# Patient Record
Sex: Female | Born: 1935 | Race: White | Hispanic: No | Marital: Married | Smoking: Never smoker
Health system: Southern US, Community
[De-identification: ages and names within clinical notes are randomized; demographics above are authoritative.]

## PROBLEM LIST (undated history)

## (undated) DIAGNOSIS — M1990 Unspecified osteoarthritis, unspecified site: Secondary | ICD-10-CM

## (undated) DIAGNOSIS — K469 Unspecified abdominal hernia without obstruction or gangrene: Secondary | ICD-10-CM

## (undated) DIAGNOSIS — I10 Essential (primary) hypertension: Secondary | ICD-10-CM

## (undated) DIAGNOSIS — K219 Gastro-esophageal reflux disease without esophagitis: Secondary | ICD-10-CM

## (undated) DIAGNOSIS — E039 Hypothyroidism, unspecified: Secondary | ICD-10-CM

## (undated) DIAGNOSIS — I739 Peripheral vascular disease, unspecified: Secondary | ICD-10-CM

## (undated) DIAGNOSIS — I4581 Long QT syndrome: Secondary | ICD-10-CM

## (undated) DIAGNOSIS — K5790 Diverticulosis of intestine, part unspecified, without perforation or abscess without bleeding: Secondary | ICD-10-CM

## (undated) DIAGNOSIS — E785 Hyperlipidemia, unspecified: Secondary | ICD-10-CM

## (undated) DIAGNOSIS — I679 Cerebrovascular disease, unspecified: Secondary | ICD-10-CM

## (undated) HISTORY — DX: Hyperlipidemia, unspecified: E78.5

## (undated) HISTORY — PX: APPENDECTOMY: SHX54

## (undated) HISTORY — DX: Hypothyroidism, unspecified: E03.9

## (undated) HISTORY — PX: TONSILLECTOMY: SUR1361

## (undated) HISTORY — DX: Unspecified abdominal hernia without obstruction or gangrene: K46.9

## (undated) HISTORY — DX: Long QT syndrome: I45.81

## (undated) HISTORY — DX: Peripheral vascular disease, unspecified: I73.9

## (undated) HISTORY — PX: BUNIONECTOMY: SHX129

## (undated) HISTORY — DX: Gastro-esophageal reflux disease without esophagitis: K21.9

## (undated) HISTORY — DX: Diverticulosis of intestine, part unspecified, without perforation or abscess without bleeding: K57.90

## (undated) HISTORY — DX: Cerebrovascular disease, unspecified: I67.9

## (undated) HISTORY — DX: Essential (primary) hypertension: I10

## (undated) HISTORY — PX: LAMINECTOMY: SHX219

## (undated) HISTORY — DX: Unspecified osteoarthritis, unspecified site: M19.90

## (undated) HISTORY — PX: OTHER SURGICAL HISTORY: SHX169

---

## 2005-07-03 ENCOUNTER — Ambulatory Visit: Payer: Self-pay | Admitting: Cardiology

## 2005-07-27 ENCOUNTER — Ambulatory Visit: Payer: Self-pay

## 2005-07-27 ENCOUNTER — Encounter: Payer: Self-pay | Admitting: Cardiology

## 2005-08-19 ENCOUNTER — Ambulatory Visit: Payer: Self-pay | Admitting: Cardiology

## 2005-09-03 ENCOUNTER — Ambulatory Visit: Payer: Self-pay | Admitting: Cardiology

## 2005-09-17 ENCOUNTER — Ambulatory Visit: Payer: Self-pay | Admitting: Internal Medicine

## 2005-10-12 ENCOUNTER — Ambulatory Visit: Payer: Self-pay | Admitting: Cardiology

## 2005-10-21 ENCOUNTER — Ambulatory Visit: Payer: Self-pay | Admitting: Cardiology

## 2006-01-27 ENCOUNTER — Ambulatory Visit: Payer: Self-pay | Admitting: Cardiology

## 2006-02-26 ENCOUNTER — Ambulatory Visit: Payer: Self-pay | Admitting: Cardiovascular Disease

## 2006-04-21 ENCOUNTER — Ambulatory Visit: Payer: Self-pay | Admitting: Cardiology

## 2006-07-07 ENCOUNTER — Ambulatory Visit: Payer: Self-pay | Admitting: Cardiology

## 2006-07-21 ENCOUNTER — Ambulatory Visit: Payer: Self-pay

## 2006-09-16 ENCOUNTER — Ambulatory Visit: Payer: Self-pay | Admitting: Cardiology

## 2006-12-15 ENCOUNTER — Ambulatory Visit: Payer: Self-pay | Admitting: Cardiology

## 2006-12-22 ENCOUNTER — Ambulatory Visit: Payer: Self-pay

## 2006-12-22 ENCOUNTER — Ambulatory Visit: Payer: Self-pay | Admitting: Cardiology

## 2006-12-22 LAB — CONVERTED CEMR LAB
BUN: 11 mg/dL (ref 6–23)
Calcium: 8.7 mg/dL (ref 8.4–10.5)
Chloride: 104 meq/L (ref 96–112)
Creatinine, Ser: 0.8 mg/dL (ref 0.4–1.2)
GFR calc Af Amer: 89 mL/min
Glucose, Bld: 87 mg/dL (ref 70–99)
Potassium: 4.2 meq/L (ref 3.5–5.1)
Sodium: 138 meq/L (ref 135–145)

## 2007-04-13 IMAGING — CT CT ANGIO ABDOMEN
1 of 6 series · 14 of 42 positions shown, 18 images · IV contrast (APPLIED)
Comparison: none

CLINICAL DATA: Abdominal pain and diverticulosis.

[Series 6: cta_abd 0.75 b10f st · axial · 0.39mm/px · z∈[-312,-42]mm · 14 of 595 slices shown, 18 images]
[im 28/595  soft-tissue]
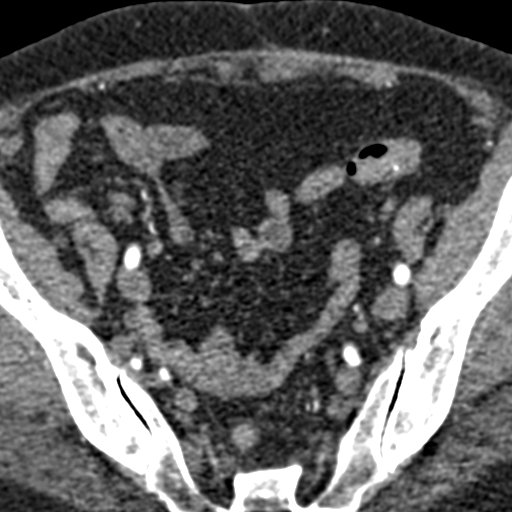
[im 28/595  bone]
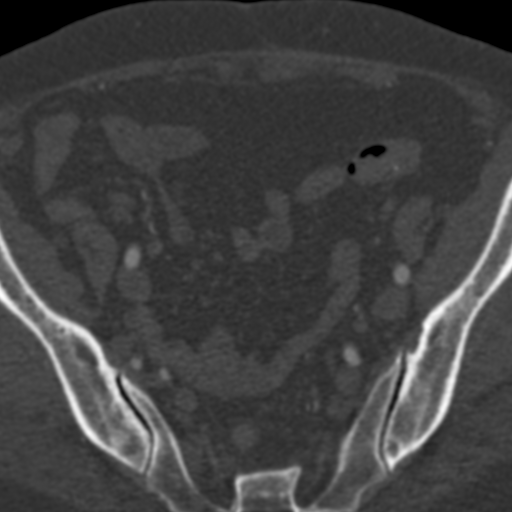
[im 82/595  soft-tissue]
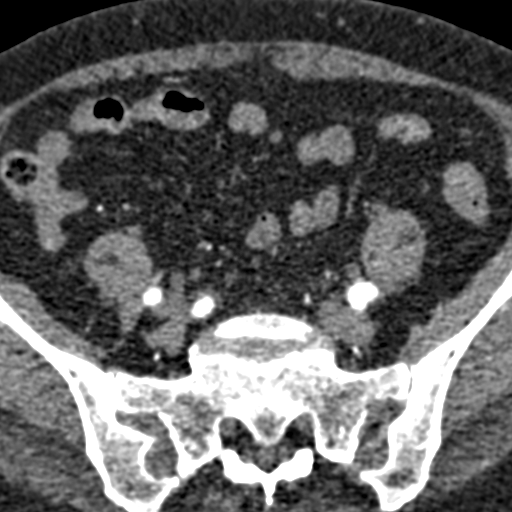
[im 136/595  soft-tissue]
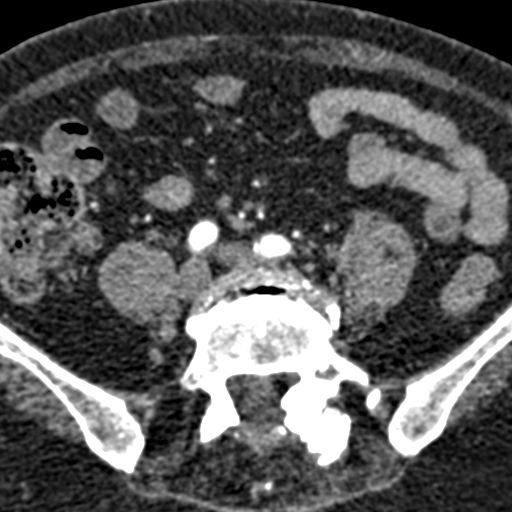
[im 190/595  soft-tissue]
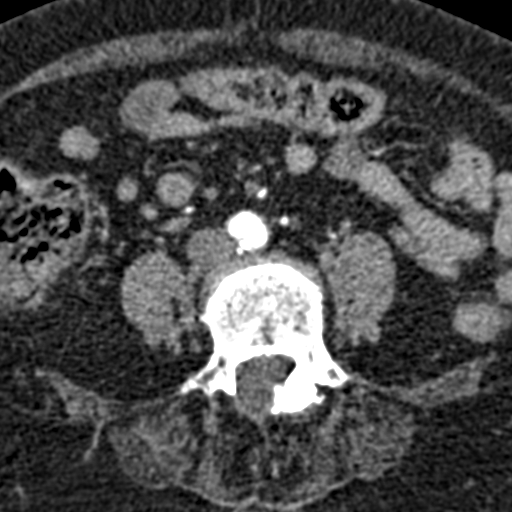
[im 217/595  soft-tissue]
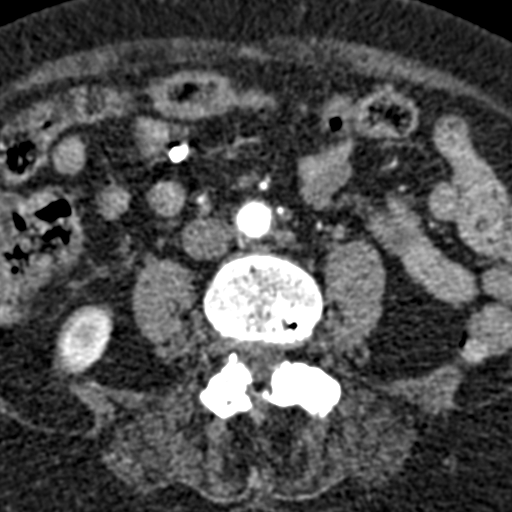
[im 271/595  soft-tissue]
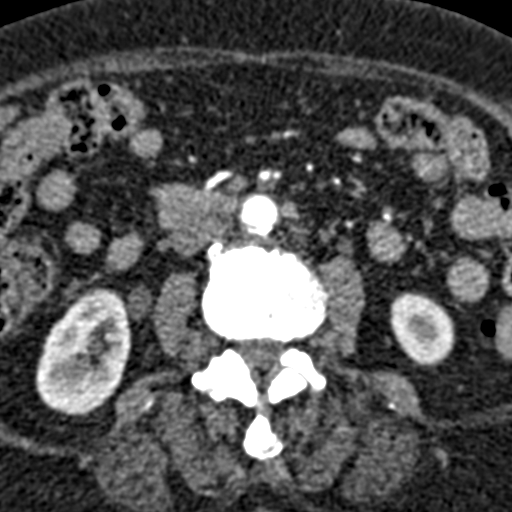
[im 325/595  soft-tissue]
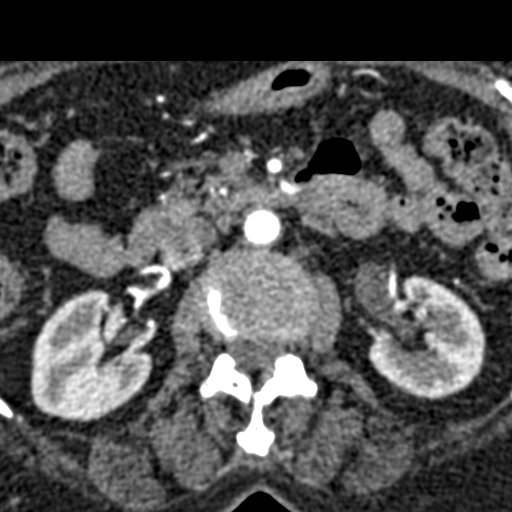
[im 379/595  soft-tissue]
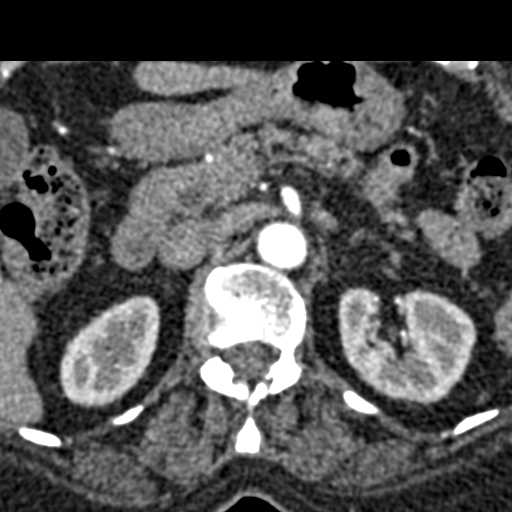
[im 406/595  soft-tissue]
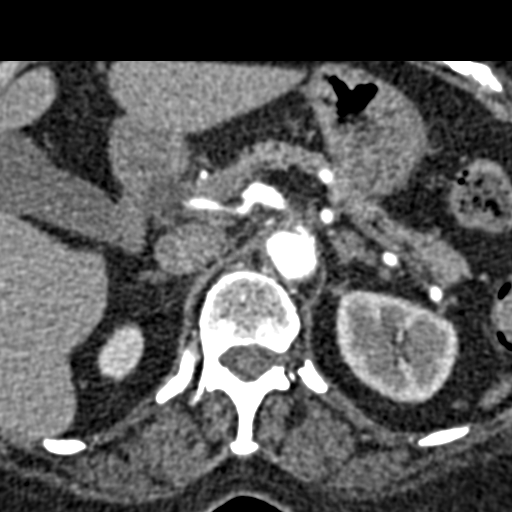
[im 406/595  bone]
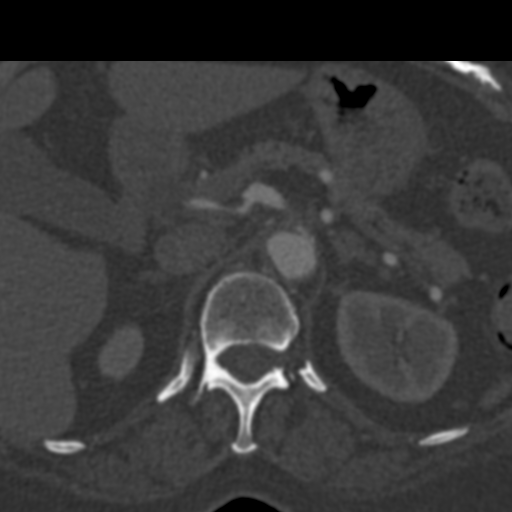
[im 460/595  soft-tissue]
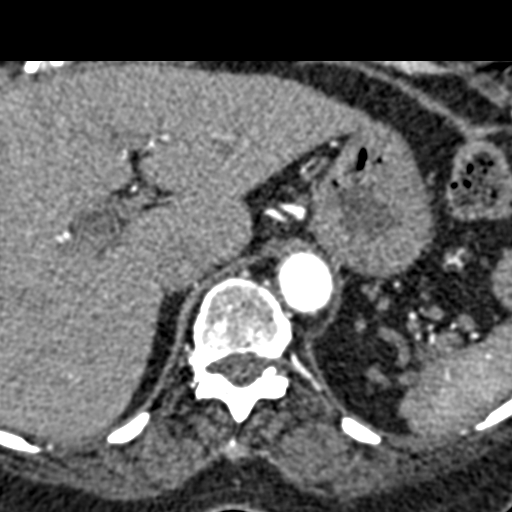
[im 487/595  lung]
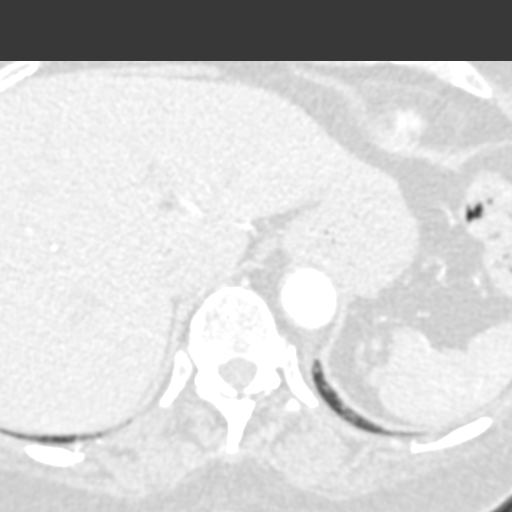
[im 514/595  soft-tissue]
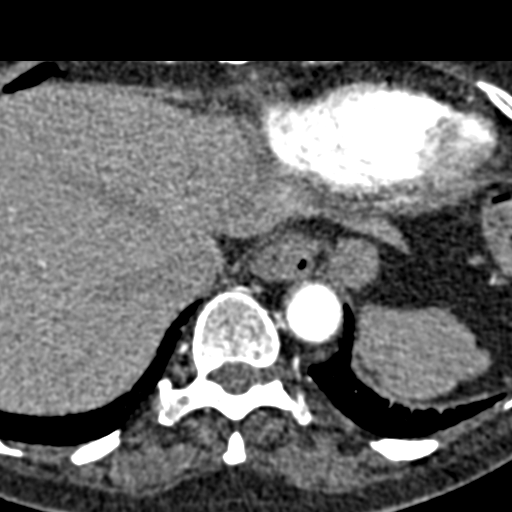
[im 514/595  lung]
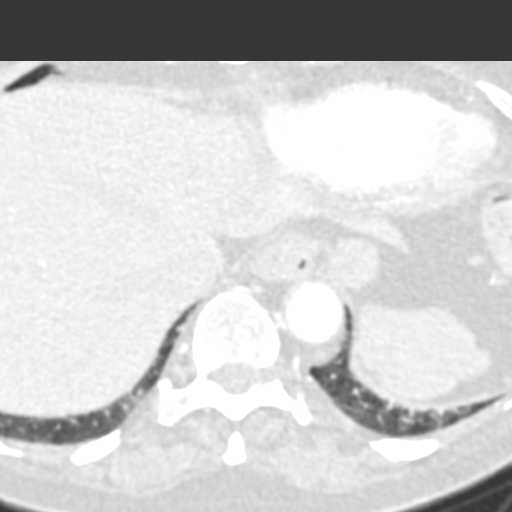
[im 541/595  lung]
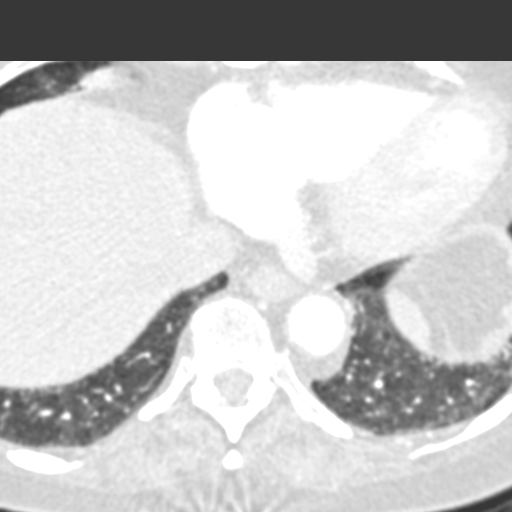
[im 568/595  soft-tissue]
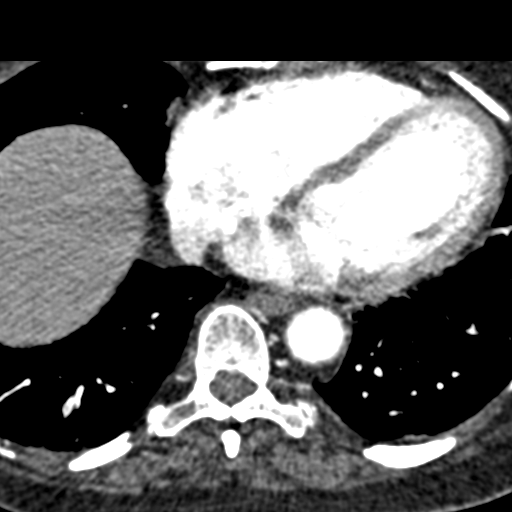
[im 568/595  lung]
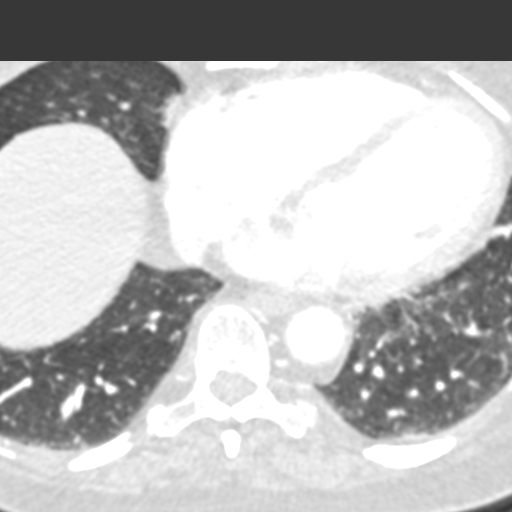

[14 of 42 positions shown; findings below may reference images not displayed]

CT angiogram abdomen with contrast:
CT angiogram pelvis with contrast:

Multidetector helical CT of the abdomen and pelvis was obtained after 150 ml
Omnipaque 300  IV. CT multiplanar reconstructions were rendered to evaluate the
vascular anatomy.
No previous for comparison. 
The initial noncontrast scan shows patchy atheromatous calcifications involving
the abdominal aorta and proximal common iliac arteries without aneurysm.
Degenerative changes in the lumbar spine.

The CTA shows moderately severe focal stenosis just beyond the origin of the
celiac axis possibly related to median arcuate ligament of the diaphragm. The
SMA is widely patent. Single left renal artery with partially calcified plaque
at its origin. There is a somewhat irregular area of narrowing approximately 1
cm from its origin as well some areas of weblike narrowing involving the more
distal aspect of the main left renal artery. The visualized intraparenchymal
branches are unremarkable. There is a single right renal artery which shows
similar areas of webs and subtle beading in its distal two-thirds. The origin is
widely patent. Inferior mesenteric artery is patent. Some plaque in the right
common iliac artery without hemodynamic  significance. Visualized portions of
external iliac arteries unremarkable.

13 mm probable simple cyst in the posterior right hepatic segment, incompletely
evaluated. Otherwise unremarkable arterial phase evaluation of liver, spleen,
adrenal glands, kidneys, pancreas, gallbladder, visualized small bowel and
colon. Multiple   subcentimeter central mesenteric lymph nodes are identified.
No ascites. No free air. Normal excretion of contrast by both kidneys on delayed
scans. Visualized lung bases clear.
IMPRESSION: 1. Changes consistent with fibromuscular dysplasia (FMD) involving both left and
right renal arteries. This can  be  a reversible source of renovascular
hypertension, and further evaluation with arteriography may be warranted.

## 2007-08-03 ENCOUNTER — Ambulatory Visit: Payer: Self-pay | Admitting: Cardiology

## 2007-08-03 ENCOUNTER — Ambulatory Visit: Payer: Self-pay

## 2007-08-03 LAB — CONVERTED CEMR LAB
Calcium: 9.3 mg/dL (ref 8.4–10.5)
Sodium: 138 meq/L (ref 135–145)

## 2007-08-09 ENCOUNTER — Ambulatory Visit: Payer: Self-pay

## 2008-02-08 ENCOUNTER — Ambulatory Visit: Payer: Self-pay | Admitting: Cardiology

## 2008-03-22 ENCOUNTER — Ambulatory Visit: Payer: Self-pay

## 2008-05-01 DIAGNOSIS — E7800 Pure hypercholesterolemia, unspecified: Secondary | ICD-10-CM | POA: Insufficient documentation

## 2008-05-01 DIAGNOSIS — I739 Peripheral vascular disease, unspecified: Secondary | ICD-10-CM | POA: Insufficient documentation

## 2008-05-01 DIAGNOSIS — I6529 Occlusion and stenosis of unspecified carotid artery: Secondary | ICD-10-CM | POA: Insufficient documentation

## 2008-05-01 DIAGNOSIS — I10 Essential (primary) hypertension: Secondary | ICD-10-CM | POA: Insufficient documentation

## 2008-05-01 DIAGNOSIS — I701 Atherosclerosis of renal artery: Secondary | ICD-10-CM | POA: Insufficient documentation

## 2008-05-01 DIAGNOSIS — Q898 Other specified congenital malformations: Secondary | ICD-10-CM | POA: Insufficient documentation

## 2008-05-01 DIAGNOSIS — Q8989 Other specified congenital malformations: Secondary | ICD-10-CM | POA: Insufficient documentation

## 2008-10-03 ENCOUNTER — Encounter (INDEPENDENT_AMBULATORY_CARE_PROVIDER_SITE_OTHER): Payer: Self-pay | Admitting: *Deleted

## 2008-11-18 ENCOUNTER — Encounter: Payer: Self-pay | Admitting: Cardiology

## 2008-11-21 ENCOUNTER — Encounter: Payer: Self-pay | Admitting: Cardiology

## 2008-11-21 ENCOUNTER — Ambulatory Visit: Payer: Self-pay | Admitting: Cardiology

## 2008-11-21 DIAGNOSIS — R55 Syncope and collapse: Secondary | ICD-10-CM | POA: Insufficient documentation

## 2008-11-30 ENCOUNTER — Encounter: Payer: Self-pay | Admitting: Cardiology

## 2008-12-12 ENCOUNTER — Encounter: Payer: Self-pay | Admitting: Cardiology

## 2008-12-31 ENCOUNTER — Ambulatory Visit: Payer: Self-pay | Admitting: Internal Medicine

## 2009-01-02 ENCOUNTER — Encounter (INDEPENDENT_AMBULATORY_CARE_PROVIDER_SITE_OTHER): Payer: Self-pay | Admitting: *Deleted

## 2009-01-29 ENCOUNTER — Telehealth: Payer: Self-pay | Admitting: Internal Medicine

## 2009-03-13 ENCOUNTER — Telehealth (INDEPENDENT_AMBULATORY_CARE_PROVIDER_SITE_OTHER): Payer: Self-pay | Admitting: *Deleted

## 2009-04-10 ENCOUNTER — Encounter: Payer: Self-pay | Admitting: Cardiology

## 2009-04-18 ENCOUNTER — Telehealth: Payer: Self-pay | Admitting: Cardiology

## 2009-04-24 ENCOUNTER — Encounter: Payer: Self-pay | Admitting: Internal Medicine

## 2009-04-25 ENCOUNTER — Ambulatory Visit: Payer: Self-pay

## 2009-04-25 ENCOUNTER — Encounter: Payer: Self-pay | Admitting: Internal Medicine

## 2009-04-25 ENCOUNTER — Encounter: Payer: Self-pay | Admitting: Cardiology

## 2009-05-10 ENCOUNTER — Ambulatory Visit: Payer: Self-pay

## 2009-05-10 ENCOUNTER — Encounter: Payer: Self-pay | Admitting: Cardiology

## 2009-05-13 ENCOUNTER — Encounter: Payer: Self-pay | Admitting: Cardiology

## 2009-05-15 ENCOUNTER — Encounter: Payer: Self-pay | Admitting: Cardiology

## 2009-05-15 ENCOUNTER — Ambulatory Visit: Payer: Self-pay | Admitting: Cardiology

## 2009-06-13 ENCOUNTER — Encounter: Payer: Self-pay | Admitting: Cardiology

## 2009-06-18 ENCOUNTER — Ambulatory Visit: Payer: Self-pay | Admitting: Internal Medicine

## 2009-07-09 ENCOUNTER — Telehealth: Payer: Self-pay | Admitting: Cardiology

## 2009-08-23 ENCOUNTER — Encounter: Payer: Self-pay | Admitting: Cardiology

## 2009-11-05 ENCOUNTER — Encounter: Payer: Self-pay | Admitting: Cardiology

## 2009-11-06 ENCOUNTER — Ambulatory Visit: Payer: Self-pay | Admitting: Cardiology

## 2009-11-06 ENCOUNTER — Encounter: Payer: Self-pay | Admitting: Cardiology

## 2009-12-02 ENCOUNTER — Encounter: Payer: Self-pay | Admitting: Cardiology

## 2010-05-07 ENCOUNTER — Encounter: Payer: Self-pay | Admitting: Internal Medicine

## 2010-05-09 ENCOUNTER — Ambulatory Visit: Admit: 2010-05-09 | Payer: Self-pay

## 2010-05-13 NOTE — Letter (Signed)
Summary: Blood Pressure Readings  Blood Pressure Readings   Imported By: Earl Many 11/25/2009 15:27:17  _____________________________________________________________________  External Attachment:    Type:   Image     Comment:   External Document

## 2010-05-13 NOTE — Letter (Signed)
Summary: [facility]   [facility] Progress Note   Imported By: Roderic Ovens 11/21/2009 12:41:15  _____________________________________________________________________  External Attachment:    Type:   Image     Comment:   External Document

## 2010-05-13 NOTE — Assessment & Plan Note (Signed)
Summary: [facility]   Visit Type:  6 months follow up  CC:  No complains.  History of Present Illness: Ms. Curtice is a pleasant female who has a history of peripheral vascular disease, hypertension and long QT syndrome.  A Myoview performed on August 09, 2007 showed an ejection fraction of 81% and there was no scar or ischemia. Previous echocardiogram in April of 2007 revealed normal LV function. The left atrium was mildly dilated. There was mild mitral regurgitation.  Carotid Dopplers in Jan 2010 showed a 40-59% right stenosis and a 0-39% left stenosis.  Renal Dopplers in Jan of 2011 revealed  a 1-59% right renal artery stenosis and the left was normal. I last saw her in August of 2010. She complained of 2 syncopal episodes at that time. We did arrange for her to be seen by Dr. Graciela Husbands. He discontinued her diuretic and calcium blocker and recommended atenolol. Note she had been intolerant to metoprolol previously. Since then she has had pneumonia and bronchitis over the past 4-5 weeks. Prior to that she was not having significant dyspnea. There is no orthopnea, PND, chest pain or recurrent syncope. When her diuretics were discontinued she did have worsening edema and she is now back on diuretics and this is improved.  Current Medications (verified): 1)  Omeprazole 40 Mg Cpdr (Omeprazole) .... Take 1 Capsule By Mouth Once A Day 2)  Atenolol 25 Mg Tabs (Atenolol) .... Take One Tablet By Mouth Twice Daily 3)  Synthroid 100 Mcg Tabs (Levothyroxine Sodium) .... Take 1 Tablet By Mouth Once A Day 4)  Premarin 0.45 Mg Tabs (Estrogens Conjugated) .... Take 1 Tablet By Mouth Once A Day 5)  Singulair 10 Mg Tabs (Montelukast Sodium) .... Take 1 Tablet By Mouth Once A Day 6)  Dicyclomine Hcl 10 Mg/ml Soln (Dicyclomine Hcl) .Marland Kitchen.. 1-2 Tabs Before Meals - 2 Tab Every 6 7)  Aspirin 81 Mg Tbec (Aspirin) .... Take One Tablet By Mouth Daily 8)  Xopenex 0.63 Mg/72ml Nebu (Levalbuterol Hcl) .... As Needed 9)   Tramadol Hcl 50 Mg Tabs (Tramadol Hcl) .... As Needed 10)  Metamucil Plus Calcium  Caps (Psyllium-Calcium) .... As Needed 11)  Proair Hfa 108 (90 Base) Mcg/act Aers (Albuterol Sulfate) .... As Needed 12)  Pravastatin Sodium 20 Mg Tabs (Pravastatin Sodium) .... Take One Tablet By Mouth Daily At Bedtime 13)  Pulmicort Flexhaler 180 Mcg/act Aepb (Budesonide) .... 2 Puffs in Am and Pm 14)  Furosemide 20 Mg Tabs (Furosemide) .... Take One Tablet By Mouth Daily. 15)  Potassium Chloride Crys Cr 20 Meq Cr-Tabs (Potassium Chloride Crys Cr) .... Take One Tablet By Mouth Daily  Allergies: 1)  ! * Advair 2)  ! * Albuterol 3)  ! * Atacand 4)  ! Biaxin 5)  ! Doxycycline 6)  ! * Estradiol 7)  ! * Gabapentin 8)  ! * Gluten 9)  ! Metoprolol Tartrate 10)  ! Simvastatin 11)  ! * Peanuts and Tree Nuts  Past History:  Past Medical History: Reviewed history from 11/21/2008 and no changes required. Asthma G E R D Hyperlipidemia Hypertension Hypothyroidism Hiatal Hernia Long QT syndrome Diverticulosis Arthritis Peripheral vascular disease (Celiac artery and SMA stenosis, right renal artery stenosis) cerebrovascular disease  Past Surgical History: Reviewed history from 05/01/2008 and no changes required. Bunionectomy Torn rotator cuff surgery Tonsillectomy Appendectomy hysterectomy laminectomy Carpel tunnel surgery  Social History: Reviewed history from 05/01/2008 and no changes required. Married  Tobacco Use - No.  Alcohol Use - yes (  occasional)  Review of Systems       Recent pneumonia and asthma but  hemoptysis, dysphasia, odynophagia, melena, hematochezia, dysuria, hematuria, rash, seizure activity, orthopnea, PND, pedal edema, claudication. Remaining systems are negative.   Vital Signs:  Patient profile:   77 year old female Height:      62 inches Weight:      146.75 pounds BMI:     26.94 Pulse rate:   57 / minute Pulse rhythm:   regular Resp:     18 per minute BP  sitting:   156 / 66  (left arm) Cuff size:   regular  Vitals Entered By: Vikki Ports (May 15, 2009 3:31 PM)  Physical Exam  General:  Well-developed well-nourished in no acute distress.  Skin is warm and dry.  HEENT is normal.  Neck is supple. No thyromegaly.  Chest is clear to auscultation with normal expansion.  Cardiovascular exam is regular rate and rhythm.  Abdominal exam nontender or distended. No masses palpated. Extremities show no edema. neuro grossly intact    EKG  Procedure date:  05/15/2009  Findings:      Sinus rhythm at a rate of 57. Cannot rule out prior septal infarct. Nonspecific ST changes. Prolonged QT.  Impression & Recommendations:  Problem # 1:  LONG QT SYNDROME (ICD-759.89) Patient has had no recurrent syncope. Continue beta-blockade. I have arranged for her to see Dr. Graciela Husbands back for discussions of genotyping and further EP followup.  Problem # 2:  CAROTID ARTERY STENOSIS, WITHOUT INFARCTION (ICD-433.10) Continue aspirin and statin. Followup carotid Dopplers January 2012. Her updated medication list for this problem includes:    Aspirin 81 Mg Tbec (Aspirin) .Marland Kitchen... Take one tablet by mouth daily  Problem # 3:  RENAL ATHEROSCLEROSIS (ICD-440.1) Continue aspirin and statin. Follow up renal Dopplers in January 2012.  Problem # 4:  HYPERCHOLESTEROLEMIA, PURE (ICD-272.0) Continued statin. Lipids and liver monitored by primary care. Her updated medication list for this problem includes:    Pravastatin Sodium 20 Mg Tabs (Pravastatin sodium) .Marland Kitchen... Take one tablet by mouth daily at bedtime  Problem # 5:  HYPERTENSION, BENIGN (ICD-401.1) Blood pressure elevated. Add Norvasc 5 mg p.o. daily. Note I cannot increase her beta blocker as her heart rate is in the 50s. The following medications were removed from the medication list:    Cozaar 50 Mg Tabs (Losartan potassium) .Marland Kitchen... Take one tablet by mouth daily    Maxzide-25 37.5-25 Mg Tabs (Triamterene-hctz)  .Marland Kitchen... Take 1/2-1 tablet as neede for fluid retention    Diltiazem Hcl Er Beads 180 Mg Xr24h-cap (Diltiazem hcl er beads) ..... Once daily Her updated medication list for this problem includes:    Atenolol 25 Mg Tabs (Atenolol) .Marland Kitchen... Take one tablet by mouth twice daily    Aspirin 81 Mg Tbec (Aspirin) .Marland Kitchen... Take one tablet by mouth daily    Furosemide 20 Mg Tabs (Furosemide) .Marland Kitchen... Take one tablet by mouth daily.    Amlodipine Besylate 5 Mg Tabs (Amlodipine besylate) .Marland Kitchen... Take one tablet by mouth daily  Patient Instructions: 1)  Your physician recommends that you schedule a follow-up appointment in: 6 MONTHS 2)  DR Graciela Husbands 06-18-09 @ 8:45AM 3)  Your physician has recommended you make the following change in your medication: START AMLODIPINE 5MG  ONCE DAILY Prescriptions: AMLODIPINE BESYLATE 5 MG TABS (AMLODIPINE BESYLATE) Take one tablet by mouth daily  #30 x 12   Entered by:   Deliah Goody, RN   Authorized by:   Howell Pringle  Jens Som, MD, [facility]   Signed by:   Deliah Goody, RN on 05/15/2009   Method used:   Electronically to        [facility] [phone]* (retail)       [address] [place], [place]  [address]       Ph: [phone]       Fax: [phone]   RxID:   [id number]

## 2010-05-13 NOTE — Progress Notes (Signed)
Summary: Patients At Home Vitals   Patients At Home Vitals   Imported By: Roderic Ovens 05/22/2009 15:52:26  _____________________________________________________________________  External Attachment:    Type:   Image     Comment:   External Document

## 2010-05-13 NOTE — Progress Notes (Signed)
       Additional Follow-up for Phone Call Additional follow up Details #2::    Called Pt.she had here Atenolol 25 mg., two times a day picked up. today.  Follow-up by: Oswald Hillock,  April 18, 2009 10:53 AM

## 2010-05-13 NOTE — Miscellaneous (Signed)
Summary: Orders Update  Clinical Lists Changes  Orders: Added new Test order of Renal Artery Duplex (Renal Artery Duplex) - Signed 

## 2010-05-13 NOTE — Progress Notes (Signed)
Summary: Patients Med List  Patients Med List   Imported By: Roderic Ovens 05/22/2009 15:53:25  _____________________________________________________________________  External Attachment:    Type:   Image     Comment:   External Document

## 2010-05-13 NOTE — Progress Notes (Signed)
Summary: elevated bp  Phone Note Call from Patient Call back at Home Phone [phone]   Caller: Patient Reason for Call: Talk to Nurse Summary of Call: elevated BP, heart pounding in throat, off balance, no chest pain or SOB Initial call taken by: Migdalia Dk,  July 09, 2009 8:41 AM  Follow-up for Phone Call        spoke with pt, her bp this am was elevated. she feels fine now wirth a bp of 141/69. she is having balance issues and alot of sinus drainage. she has taken claritin and that has helped. she will call back with further problems Deliah Goody, RN  July 09, 2009 2:32 PM

## 2010-05-13 NOTE — Letter (Signed)
Summary: [facility]   [facility] Progress Note   Imported By: Roderic Ovens 11/21/2009 12:41:43  _____________________________________________________________________  External Attachment:    Type:   Image     Comment:   External Document

## 2010-05-13 NOTE — Assessment & Plan Note (Signed)
Summary: [facility]   Visit Type:  Follow-up  CC:  Sob.  History of Present Illness: [name] is a pleasant female who has a history of peripheral vascular disease, hypertension and long QT syndrome.  A Myoview performed on August 09, 2007 showed an ejection fraction of 81% and there was no scar or ischemia. Previous echocardiogram in April of 2007 revealed normal LV function. The left atrium was mildly dilated. There was mild mitral regurgitation.  Carotid Dopplers in Jan 2011 showed a 40-59% right stenosis and a 0-39% left stenosis.  Renal Dopplers in Jan of 2011 revealed  a 1-59% right renal artery stenosis and the left was normal. I last saw her in Feb 2011. Since then, she has some dyspnea that she attributes to asthma and allergies. There is no orthopnea, PND, pedal edema, syncope or chest pain. She does complain of fatigue and her heart rate is in the 50s at times by her report.  Current Medications (verified): 1)  Omeprazole 40 Mg Cpdr (Omeprazole) .... Take 1 Capsule By Mouth Once A Day 2)  Atenolol 25 Mg Tabs (Atenolol) .... Take One Tablet By Mouth Twice Daily 3)  Synthroid 100 Mcg Tabs (Levothyroxine Sodium) .... Take 1 Tablet By Mouth Once A Day 4)  Premarin 0.45 Mg Tabs (Estrogens Conjugated) .... Take 1 Tablet By Mouth Once A Day 5)  Singulair 10 Mg Tabs (Montelukast Sodium) .... Take 1 Tablet By Mouth Once A Day 6)  Dicyclomine Hcl 10 Mg/ml Soln (Dicyclomine Hcl) .Marland Kitchen.. 1-2 Tabs Before Meals - 2 Tab Every 6 7)  Aspirin 81 Mg Tbec (Aspirin) .... Take One Tablet By Mouth Daily 8)  Xopenex 0.63 Mg/38ml Nebu (Levalbuterol Hcl) .... As Needed 9)  Tramadol Hcl 50 Mg Tabs (Tramadol Hcl) .... As Needed 10)  Metamucil Plus Calcium  Caps (Psyllium-Calcium) .... As Needed 11)  Pravastatin Sodium 20 Mg Tabs (Pravastatin Sodium) .... Take One Tablet By Mouth Daily At Bedtime 12)  Pulmicort Flexhaler 180 Mcg/act Aepb (Budesonide) .... 2 Puffs in Am and Pm 13)  Furosemide 20 Mg Tabs  (Furosemide) .... Take One Tablet By Mouth Daily. 14)  Potassium Chloride Crys Cr 20 Meq Cr-Tabs (Potassium Chloride Crys Cr) .... Take One Tablet By Mouth Daily 15)  Amlodipine Besylate 5 Mg Tabs (Amlodipine Besylate) .... Take One Tablet By Mouth Daily 16)  Losartan Potassium 50 Mg Tabs (Losartan Potassium) .... Take 1 Tablet By Mouth Once A Day 17)  Epipen 0.3 Mg/0.51ml Devi (Epinephrine) .... As Needed 18)  Hyomax-Ft 0.125 Mg Tbdp (Hyoscyamine Sulfate) .... As Needed 19)  Doxycycline Hyclate 100 Mg Caps (Doxycycline Hyclate) .... As Needed 20)  Acyclovir 800 Mg Tabs (Acyclovir) .... As Needed  Allergies: 1)  ! * Advair 2)  ! * Albuterol 3)  ! * Atacand 4)  ! Biaxin 5)  ! Doxycycline 6)  ! * Estradiol 7)  ! * Gabapentin 8)  ! * Gluten 9)  ! Metoprolol Tartrate 10)  ! Simvastatin 11)  ! * Peanuts and Tree Nuts  Past History:  Past Medical History: Reviewed history from 11/21/2008 and no changes required. Asthma G E R D Hyperlipidemia Hypertension Hypothyroidism Hiatal Hernia Long QT syndrome Diverticulosis Arthritis Peripheral vascular disease (Celiac artery and SMA stenosis, right renal artery stenosis) cerebrovascular disease  Past Surgical History: Reviewed history from 05/01/2008 and no changes required. Bunionectomy Torn rotator cuff surgery Tonsillectomy Appendectomy hysterectomy laminectomy Carpel tunnel surgery  Social History: Reviewed history from 05/01/2008 and no changes required. Married  Tobacco Use - No.  Alcohol Use - yes (occasional)  Review of Systems       Some lower extremity pain and fatigue but no fevers or chills, productive cough, hemoptysis, dysphasia, odynophagia, melena, hematochezia, dysuria, hematuria, rash, seizure activity, orthopnea, PND, pedal edema, claudication. Remaining systems are negative.   Vital Signs:  Patient profile:   77 year old female Height:      62 inches Weight:      149.50 pounds BMI:     27.44 Pulse  rate:   66 / minute Pulse rhythm:   regular Resp:     20 per minute BP sitting:   152 / 64  (right arm) Cuff size:   large  Vitals Entered By: Vikki Ports (November 06, 2009 3:46 PM)  Physical Exam  General:  Well-developed well-nourished in no acute distress.  Skin is warm and dry.  HEENT is normal.  Neck is supple. No thyromegaly.  Chest is clear to auscultation with normal expansion.  Cardiovascular exam is regular rate and rhythm.  Abdominal exam nontender or distended. No masses palpated. Extremities show no edema. neuro grossly intact    EKG  Procedure date:  11/06/2009  Findings:      Sinus bradycardia at a rate of 53. Cannot rule out prior septal infarct. First degree AV block. Prolonged QT.  Impression & Recommendations:  Problem # 1:  CAROTID ARTERY STENOSIS, WITHOUT INFARCTION (ICD-433.10) Continue aspirin and statin. Followup carotid Dopplers January 2012. Her updated medication list for this problem includes:    Aspirin 81 Mg Tbec (Aspirin) .Marland Kitchen... Take one tablet by mouth daily  Problem # 2:  RENAL ATHEROSCLEROSIS (ICD-440.1) Continue aspirin and statin. Followup renal Dopplers January 2012.  Problem # 3:  LONG QT SYNDROME (ICD-759.89) Continue atenolol. Given complaints of fatigue I will decrease to 25 mg p.o. daily. I have asked her to take this at night.  Problem # 4:  HYPERCHOLESTEROLEMIA, PURE (ICD-272.0) Continue statin. Lipids and liver monitored by primary care. Her updated medication list for this problem includes:    Pravastatin Sodium 20 Mg Tabs (Pravastatin sodium) .Marland Kitchen... Take one tablet by mouth daily at bedtime  Problem # 5:  HYPERTENSION, BENIGN (ICD-401.1) Patient did bring records from home concerning her blood pressure. It is controlled. Renal function and potassium monitored by primary care. Her updated medication list for this problem includes:    Atenolol 25 Mg Tabs (Atenolol) .Marland Kitchen... 1 once daily    Aspirin 81 Mg Tbec (Aspirin) .Marland Kitchen...  Take one tablet by mouth daily    Furosemide 20 Mg Tabs (Furosemide) .Marland Kitchen... Take one tablet by mouth daily.    Amlodipine Besylate 5 Mg Tabs (Amlodipine besylate) .Marland Kitchen... Take one tablet by mouth daily    Losartan Potassium 50 Mg Tabs (Losartan potassium) .Marland Kitchen... Take 1 tablet by mouth once a day  Patient Instructions: 1)  Your physician recommends that you schedule a follow-up appointment in: 6 MONTHS WITH DR CRENSHAW 2)  Your physician has recommended you make the following change in your medication: DECREASE ATENOLOL TO 25 MG 1 EVERY PM 3)  Your physician has requested that you have a carotid duplex. This test is an ultrasound of the carotid arteries in your neck. It looks at blood flow through these arteries that supply the brain with blood. Allow one hour for this exam. There are no restrictions or special instructions. DUE IN JANUARY 2012 4)  Your physician has requested that you have a renal artery duplex. During this test, an  ultrasound is used to evaluate blood flow to the kidneys. Allow one hour for this exam. Do not eat after midnight the day before and avoid carbonated beverages. Take your medications as you usually do.DUE  IN JANUARY 2012

## 2010-05-13 NOTE — Consult Note (Signed)
Summary: [facility] Medicine Referral Form   [facility] Referral Form   Imported By: Roderic Ovens 11/21/2009 12:39:38  _____________________________________________________________________  External Attachment:    Type:   Image     Comment:   External Document

## 2010-05-13 NOTE — Miscellaneous (Signed)
Summary: Orders Update  Clinical Lists Changes  Orders: Added new Test order of Carotid Duplex (Carotid Duplex) - Signed 

## 2010-05-13 NOTE — Assessment & Plan Note (Signed)
Summary: ROV/DISCUSS LONG QT/DM   CC:  rov/discuss long QT.  History of Present Illness: Mr. Sherry Henson is seen again at the request of Dr. Jens Som to further explore screening in the family for long QT.  As I review the history the fatal event occurred and a grand nephew. His sister has been diagnosed as has his mother. The latter is the daughter of the patient's brother who also carried a diagnosis of long QT.  The patient has had no recurrent syncope since being put on atenolol.  Interestingly her ECG from early February has a QTc interval of 511 ms this is up significantly from one I'd seen in December.  Current Medications (verified): 1)  Omeprazole 40 Mg Cpdr (Omeprazole) .... Take 1 Capsule By Mouth Once A Day 2)  Atenolol 25 Mg Tabs (Atenolol) .... Take One Tablet By Mouth Twice Daily 3)  Synthroid 100 Mcg Tabs (Levothyroxine Sodium) .... Take 1 Tablet By Mouth Once A Day 4)  Premarin 0.45 Mg Tabs (Estrogens Conjugated) .... Take 1 Tablet By Mouth Once A Day 5)  Singulair 10 Mg Tabs (Montelukast Sodium) .... Take 1 Tablet By Mouth Once A Day 6)  Dicyclomine Hcl 10 Mg/ml Soln (Dicyclomine Hcl) .Marland Kitchen.. 1-2 Tabs Before Meals - 2 Tab Every 6 7)  Aspirin 81 Mg Tbec (Aspirin) .... Take One Tablet By Mouth Daily 8)  Xopenex 0.63 Mg/72ml Nebu (Levalbuterol Hcl) .... As Needed 9)  Tramadol Hcl 50 Mg Tabs (Tramadol Hcl) .... As Needed 10)  Metamucil Plus Calcium  Caps (Psyllium-Calcium) .... As Needed 11)  Proair Hfa 108 (90 Base) Mcg/act Aers (Albuterol Sulfate) .... As Needed 12)  Pravastatin Sodium 20 Mg Tabs (Pravastatin Sodium) .... Take One Tablet By Mouth Daily At Bedtime 13)  Pulmicort Flexhaler 180 Mcg/act Aepb (Budesonide) .... 2 Puffs in Am and Pm 14)  Furosemide 20 Mg Tabs (Furosemide) .... Take One Tablet By Mouth Daily. 15)  Potassium Chloride Crys Cr 20 Meq Cr-Tabs (Potassium Chloride Crys Cr) .... Take One Tablet By Mouth Daily 16)  Amlodipine Besylate 5 Mg Tabs (Amlodipine  Besylate) .... Take One Tablet By Mouth Daily  Allergies (verified): 1)  ! * Advair 2)  ! * Albuterol 3)  ! * Atacand 4)  ! Biaxin 5)  ! Doxycycline 6)  ! * Estradiol 7)  ! * Gabapentin 8)  ! * Gluten 9)  ! Metoprolol Tartrate 10)  ! Simvastatin 11)  ! * Peanuts and Tree Nuts  Past History:  Past Medical History: Last updated: 11/21/2008 Asthma [facility] Hyperlipidemia Hypertension Hypothyroidism Hiatal Hernia Long QT syndrome Diverticulosis Arthritis Peripheral vascular disease (Celiac artery and SMA stenosis, right renal artery stenosis) cerebrovascular disease  Past Surgical History: Last updated: 05/01/2008 Bunionectomy Torn rotator cuff surgery Tonsillectomy Appendectomy hysterectomy laminectomy Carpel tunnel surgery  Family History: Last updated: 2009/01/05 Nephew found dead in hotel in [place] No coronary artery disease mother with recurrent auditory stimulus syncope  Social History: Last updated: 05/01/2008 Married  Tobacco Use - No.  Alcohol Use - yes (occasional)  Vital Signs:  Patient profile:   77 year old female Height:      62 inches Weight:      147 pounds BMI:     26.98 Pulse rate:   58 / minute Pulse rhythm:   regular BP sitting:   130 / 66  (left arm) Cuff size:   regular  Vitals Entered By: Sherry Henson CMA (June 18, 2009 8:56 AM)  Physical Exam  General:  The patient was alert and oriented in no acute distress. HEENT Normal.  Neck veins were flat, carotids were brisk.  Lungs were clear.  Heart sounds were regular without murmurs or gallops.  Abdomen was soft with active bowel sounds. There is no clubbing cyanosis or edema. Skin Warm and dry    Impression & Recommendations:  Problem # 1:  SYNCOPE (ICD-780.2) None  recurrent Her updated medication list for this problem includes:    Atenolol 25 Mg Tabs (Atenolol) .Marland Kitchen... Take one tablet by mouth twice daily    Aspirin 81 Mg Tbec (Aspirin) .Marland Kitchen... Take one tablet by  mouth daily    Amlodipine Besylate 5 Mg Tabs (Amlodipine besylate) .Marland Kitchen... Take one tablet by mouth daily  Problem # 2:  LONG QT SYNDROME (ICD-759.89) We had a lengthy discussion comprising 50% of our 28  minute visit Re: the treatment and the diagnosis of long QT syndrome the role of genetic testing. The patient will be in contact with her niece and grandniece to see about genetic testing. We have also given him the website for Gene [facility]. We will see her again at Dr. Ludwig Clarks request.

## 2010-05-13 NOTE — Letter (Signed)
Summary: Patient's Medication list and Medical History  Patient's Medication list and Medical History   Imported By: Kassie Mends 04/16/2009 09:41:49  _____________________________________________________________________  External Attachment:    Type:   Image     Comment:   External Document

## 2010-05-13 NOTE — Letter (Signed)
Summary: [facility]  [facility]   Imported By: Marylou Mccoy 01/21/2010 10:11:59  _____________________________________________________________________  External Attachment:    Type:   Image     Comment:   External Document

## 2010-05-15 ENCOUNTER — Encounter (INDEPENDENT_AMBULATORY_CARE_PROVIDER_SITE_OTHER): Payer: MEDICARE

## 2010-05-15 ENCOUNTER — Encounter: Payer: Self-pay | Admitting: Internal Medicine

## 2010-05-15 DIAGNOSIS — I6529 Occlusion and stenosis of unspecified carotid artery: Secondary | ICD-10-CM

## 2010-05-15 NOTE — Miscellaneous (Signed)
Summary: Orders Update  Clinical Lists Changes  Orders: Added new Test order of Carotid Duplex (Carotid Duplex) - Signed 

## 2010-07-11 ENCOUNTER — Other Ambulatory Visit: Payer: Self-pay | Admitting: *Deleted

## 2010-07-11 MED ORDER — ATENOLOL 25 MG PO TABS: 25.0000 mg | ORAL_TABLET | Freq: Every day | ORAL | Status: DC

## 2010-08-26 NOTE — Assessment & Plan Note (Signed)
[facility]                            CARDIOLOGY OFFICE NOTE   Sherry Henson, Sherry Henson                         MRN:          [id number]  DATE:02/08/2008                            DOB:          11-Mar-1928    Sherry Henson is a pleasant female who has a history of peripheral  vascular disease, hypertension and long QT syndrome.  When I saw her  previously, we scheduled her to have a Myoview, which was performed on  August 09, 2007.  At that time, her ejection fraction was 81% and there  was no scar or ischemia.  Since I last saw her, she is not having any  increased dyspnea, although she is complaining of allergies.  There is  no orthopnea, PND, pedal edema, palpitations, presyncope, syncope, or  exertional chest pain.   MEDICATIONS:  1. Advair.  2. Omeprazole.  3. Cardizem 120 mg p.o. daily.  4. Dicyclomine.  5. Synthroid.  6. Hydrochlorothiazide 12.5 mg p.o. daily.  7. Premarin.  8. Zocor 20 mg p.o. daily.  9. Singulair.  10.Xyzal.  She also takes;  1. Aspirin 81 mg p.o. daily.  2. Citracal.  3. FiberCon.  4. Multivitamin.   PHYSICAL EXAMINATION:  VITAL SIGNS:  Today shows a blood pressure of  150/61 and pulse is 68.  She weighs 145 pounds.  HEENT:  Normal.  NECK:  Supple.  CHEST:  Clear.  CARDIOVASCULAR:  Regular rhythm.  ABDOMEN:  No tenderness.  EXTREMITIES:  No edema.   Her electrocardiogram shows a sinus rhythm at a rate of 66.  A prior  septal infarct cannot be excluded.   DIAGNOSES:  1. Hypertension - Sherry Henson's blood pressure is mildly elevated.  I      will increase her Cardizem to 180 mg p.o. daily.  We will track      this and increase as needed.  2. Peripheral vascular disease - she is due for followup carotid      Dopplers.  We will arrange those.  She will need followup renal      Dopplers in April 2010.  She will continue on her aspirin and      statin.  3. History of long QT syndrome - she has not had any syncopal    episodes.  She has not tolerated the beta blockers in the past.  4. Hyperlipidemia - she will continue on her statin and Dr. Drue Second is      managing this.  5. History of asthma.   She will see me back in 9 months.     Madolyn Frieze Jens Som, MD, [facility]  Electronically Signed    BSC/MedQ  DD: 02/08/2008  DT: 02/09/2008  Job #: [id number]   cc:   Dalbert Mayotte, MD

## 2010-08-26 NOTE — Progress Notes (Signed)
Sherry Henson                        PERIPHERAL VASCULAR OFFICE NOTE   Sherry Henson, Sherry Henson                         MRN:          [id number]  DATE:09/16/2006                            DOB:          09-16-1927    HISTORY OF PRESENT ILLNESS:  Sherry Henson is a 77 year old lady with  hypertension, hypercholesterolemia, and long QT syndrome.  I met her in  May of 2007 for evaluation of abdominal discomfort in the setting of  duplex ultrasound and CT evidence of gastroduodenal and SMA stenoses.  It seemed that her symptoms were more consistent with irritable bowel  syndrome.  This has been treated.  Symptoms have resolved.  She only has  rare abdominal discomfort now.  When she does, it responds to p.r.n.  treatment with dicyclomine.  She has not been loosing any weight.   CURRENT MEDICATIONS:  1. Cozaar 100 mg once daily.  2. Dicyclomine 250 mg q.i.d. plus p.r.n.  3. Aciphex 40 mg daily.  4. Levothyroxine 100 mcg daily.  5. Premarin 0.45 mg daily.  6. Pulmicort 1 puff once daily.  7. Simvastatin 20 mg q.h.s.  8. Singulair 10 mg daily.  9. Nasacort spray once daily.  10.Aspirin 81 mg daily.  11.Fibercon.  12.Celebrex.   PHYSICAL EXAMINATION:  GENERAL:  She is generally well-appearing in no  distress.  VITAL SIGNS:  Heart rate 72, blood pressure 136/66, weight of 154  pounds.  Weight is up a pound from a year ago.  NECK:  She has no jugular venous distension, thyromegaly, or  lymphadenopathy.  LUNGS:  Clear to auscultation.  CARDIAC:  She has a nondisplaced point of maximal cardiac impulse.  There is a regular rate and rhythm without murmur, rub, or gallop.  ABDOMEN:  Soft, nondistended, nontender.  There is no  hepatosplenomegaly.  Bowel sounds are normal.  There is no abdominal  bruit.  EXTREMITIES:  Warm without clubbing, cyanosis, edema, or ulceration.   IMPRESSION/RECOMMENDATIONS:  Superior mesenteric artery and celiac  stenoses.  These are  apparently asymptomatic, as her symptoms have  responded very nicely to treatment for  irritable bowel syndrome.  We will therefore manage conservatively with  secondary prevention efforts as have already been undertaken by Dr.  Jens Som.     Salvadore Farber, MD  Electronically Signed    WED/MedQ  DD: 09/16/2006  DT: 09/16/2006  Job #: [id number]

## 2010-08-26 NOTE — Assessment & Plan Note (Signed)
[facility]                            CARDIOLOGY OFFICE NOTE   Sherry Henson, Sherry Henson                         MRN:          [id number]  DATE:12/15/2006                            DOB:          [birth year]    Sherry Henson is a pleasant female who has a history of peripheral  vascular disease, hypertension, and history of long QT syndrome. Since I  last saw her she is somewhat depressed as her brother died in 08/23/2022. She  denies any dyspnea, chest pain, palpitations, or syncope. There is no  pedal edema. Her medications include:  1. Cozaar 100 mg p.o. daily.  2. Dicyclomine.  3. Aciphex.  4. Levothyroxine 100 mcg p.o. daily.  5. Premarin.  6. Pulmicort.  7. Zocor 20 mg p.o. daily.  8. Singulair.  9. Nasacort.  10.Aspirin 81 mg p.o. daily.  11.Biotin.  12.Celebrex.  13.__________.   PHYSICAL EXAMINATION:  Today shows a blood pressure of 151/71 and her  pulse is 76. She weighs 151 pounds.  HEENT: Normal.  NECK: Supple.  CHEST: Clear.  CARDIOVASCULAR EXAM: Reveals a regular rate and rhythm.  ABDOMINAL EXAM: Benign.  EXTREMITIES: Show no edema.   DIAGNOSES:  1. Hypertension- the patient's blood pressure remains elevated. Of      note she has not tolerated multiple medications in the past. She      apparently had a cough with ACE inhibitors and did not tolerate      beta blockers. Therefore, we will add hydrochlorothiazide and we      have also recommended a high potassium diet. I will check a BMET in      1 week. Given her history of long QT we will would like to be      diligent in making sure that she does not become hypokalemic. She      will continue to track her blood pressure at home and we will      adjust as indicated.  2. History of long QT syndrome- she has not had a syncopal episode.  3. History of peripheral vascular disease with celiac and renal artery      disease- she will need follow up renal Doppler in 08-23-07.      Note she  also has a left carotid bruits and we will check carotid      Doppler today.  4. Hyperlipidemia-she will continue on her statin and this is being      followed by Dr. Drue Second.  5. History of asthma.   We will see her back in 6 months.     Madolyn Frieze Jens Som, MD, [facility]  Electronically Signed    BSC/MedQ  DD: 12/15/2006  DT: 12/15/2006  Job #: [id number]   cc:   Dalbert Mayotte, M.D.

## 2010-08-26 NOTE — Assessment & Plan Note (Signed)
[facility]                            CARDIOLOGY OFFICE NOTE   Sherry Henson, Sherry Henson                         MRN:          [id number]  DATE:08/03/2007                            DOB:          11-Jul-1927    Sherry Henson is a very pleasant female who has a history of peripheral  vascular disease, hypertension, and long QT syndrome.  Since I last saw  her she has had some dyspnea on exertion, but attributes this to  bronchitis.  However, there is no orthopnea, PND, pedal edema,  palpitations, presyncope, syncope or exertional chest pain.  She does  not have abdominal pain after eating.   MEDICATIONS AT PRESENT:  1. Cozaar 100 mg p.o. daily.  2. Dicyclomine.  3. Cardizem 120 mg p.o. daily.  4. Hydrochlorothiazide 12.5 mg p.o. daily.  5. Naproxen 500 mg p.o. b.i.d.  6. Multivitamins.  7. Super B complex.  8. Aciphex.  9. Levothyroxine 100 mcg p.o. daily.  10.Premarin.  11.Pulmicort.  12.Zocor 20 mg p.o. daily.  13.Singulair.  14.Aspirin 81 mg p.o. daily.  15.Citracal.  16.FiberCon.  17.Xyzal.   PHYSICAL EXAMINATION:  VITAL SIGNS:  Blood pressure 142/59, pulse 64,  weighs 144 pounds.  HEENT:  Normal.  NECK:  Supple.  CHEST:  Clear.  CARDIOVASCULAR:  Regular rate and rhythm.  ABDOMEN:  Shows no tenderness.  EXTREMITIES:  Show no edema.   Her electrocardiogram shows a sinus rhythm at a rate 61.  There is a  first degree AV block.  A prior anterior infarct cannot be excluded.  Her QT is prolonged.  There are nonspecific ST changes.   DIAGNOSES:  1. Hypertension - her blood pressure appears to be adequately      controlled.  I will check a BMET today to follow her potassium and      renal function.  We would like to make sure that she does not      become significantly hyperkalemic given her history of long QT.  2. History of long QT syndrome - she has not had a syncopal episode,      and she has not tolerated beta blockers in the past.  3.  History of peripheral vascular disease - she does have celiac      disease and renal artery stenosis.  She had a follow-up ultrasound      today, and we will review those results.  She also has a history of      cerebrovascular disease.  She will need follow-up carotid Dopplers      in September.  She will continue on her aspirin and her statin.      Note given her history of peripheral vascular disease and dyspnea I      will schedule her to have a Myoview for risk stratification.  4. Hyperlipidemia - she will continue on simvastatin.  This is being      managed by Dr. Drue Second.  5. History of asthma.   We will see her back in six months.     Madolyn Frieze Jens Som, MD, [facility]  Electronically  Signed    BSC/MedQ  DD: 08/03/2007  DT: 08/03/2007  Job #: [id number]   cc:   Dalbert Mayotte, M.D.

## 2010-08-27 ENCOUNTER — Other Ambulatory Visit: Payer: Self-pay | Admitting: Cardiology

## 2010-08-29 NOTE — Assessment & Plan Note (Signed)
[facility]                              CARDIOLOGY OFFICE NOTE   MEGHNA, HAGMANN                         MRN:          [id number]  DATE:10/21/2005                            DOB:          1928/04/12    [name] returns for followup today.  Please refer to my note of Aug 19, 2005 for details.  She has a history of long Q-T syndrome, hypertension, and  peripheral vascular disease.  We did refer her to Dr. Samule Ohm concerning her  peripheral vascular disease.  He scheduled her to have a CTA of her abdomen.  This was felt to be possibly consistent with fibromuscular dysplasia  involving the left and right renal arteries.  There is also a moderately  severe focal stenosis just beyond the origin of the celiac axis.  She does  have occasional abdominal pain but has also been diagnosed with irritable  bowel syndrome.  She has therefore been treated medically.  Since I last saw  her, there is no dyspnea or chest pain and no syncope.   MEDICATIONS:  1.  Levothyroxine 100 mcg p.o. daily.  2.  Premarin 0.45 mg p.o. daily.  3.  Singulair 10 mg p.o. daily.  4.  Pulmicort.  5.  Levbid.  6.  Celebrex.  7.  Tramadol.  8.  Aspirin 81 mg p.o. daily.  9.  Fibercon.  10. Citracal.  11. Fish oil.  12. Vitamin B1, B6, and B12.  13. AcipHex.  14. Zocor 40 mg p.o. nightly.  15. Toprol 12.5 mg p.o. daily.   PHYSICAL EXAMINATION:  VITAL SIGNS: Blood pressure 161/69, pulse 63.  She  weighs 151 pounds.  NECK:  Supple.  CHEST:  Clear.  CARDIOVASCULAR:  Regular rate and rhythm.  EXTREMITIES:  No edema.   Her electrocardiogram shows a sinus rhythm at a rate of 62.  There is poor R  wave progression, and a prolonged Q-T is noted.   DIAGNOSES:  1.  Long Q-T syndrome.  2.  Peripheral vascular disease with celiac and renal artery disease.  3.  Hypertension.  4.  History of hyperlipidemia.  5.  Asthma.   PLAN:  Ms. Klawitter is doing well from a symptomatic  standpoint.  She does  occasionally have abdominal pain but also has been diagnosed with irritable  bowel syndrome.  We have elected to treat her peripheral vascular disease  medically.  She will continue on her Zocor, and her recent lipids were  outstanding with normal liver functions.  Her renal function is also normal.  We will continue with her aspirin and Zocor.  Note her blood pressure is  mildly elevated, and she has been checking it at home with similar results.  We will increase her Toprol to 25 mg p.o. daily.  She may require a second  agent in the future, and we could add Norvasc.  I am hesitant to add  hydrochlorothiazide given her history of long Q-T syndrome and the  possibility of hypokalemia worsening her syndrome.  She also apparently has  not tolerated ACE inhibitors or  ARBs in the past.  Again, she has not had  any history of syncope.  She is scheduled to see Dr. Samule Ohm back in one  year.  I will see her back in three months to adjust her blood pressure  regimen.                              Madolyn Frieze Jens Som, MD, [facility]    BSC/MedQ  DD:  10/21/2005  DT:  10/21/2005  Job #:  [id number]   cc:   Dalbert Mayotte, MD

## 2010-08-29 NOTE — Assessment & Plan Note (Signed)
[facility]                            CARDIOLOGY OFFICE NOTE   ISAAC, LACSON                         MRN:          [id number]  DATE:07/07/2006                            DOB:          03-Jan-1928    [name] returns for follow-up today.  She has a history of  peripheral vascular disease, hypertension, and long QT syndrome.  Since  I last saw her, she has done reasonably well.  She has dyspnea with  walking up hills but otherwise has done well.  There is no chest pain,  palpitations or syncope.  Her pedal edema has improved off of her  Norvasc.   MEDICATIONS:  1. AcipHex 40 mg p.o. daily.  2. Levothyroxine 100  mcg p.o. daily.  3. Premarin 0.45 mg p.o. daily.  4. Pulmicort.  5. Zocor 20 mg p.o. nightly.  6. Singulair.  7. Nasacort.  8. Aspirin 81 mg p.o. daily.  9. Biotin.  10.Citracal.  11.FiberCon.  12.Celebrex.  13.Cozaar 50 mg p.o. daily.  14.Xyzal 5 mg p.o. daily.   PHYSICAL EXAMINATION:  VITAL SIGNS:  Blood pressure 186/79, pulse 68,  weight 151 pounds.  NECK:  Supple.  CHEST:  Clear.  CARDIOVASCULAR:  Regular rate and rhythm.  EXTREMITIES:  No edema.   DIAGNOSES:  1. Hypertension.  2. Long QT syndrome.  3. History of peripheral vascular disease with celiac and renal artery      disease.  4. Hyperlipidemia.  5. Asthma.   PLAN:  [name] is doing well from symptomatic standpoint.  Her  blood pressure is elevated today.  She did bring records and her  systolic runs in the 120 to 160 range.  I have asked her to increase her  Cozaar to 100 mg p.o. daily and we will check a BMET in one week to  follow potassium and renal function.  She will need follow-up renal  Dopplers given question of renal artery stenosis.  Note she also has a  history of moderate focal stenosis of the proximal celiac  axis.  We will arrange for her to see Dr. Samule Ohm prior to him leaving in  June.  I will see her back in six months.     Madolyn Frieze Jens Som, MD, [facility]  Electronically Signed    BSC/MedQ  DD: 07/07/2006  DT: 07/07/2006  Job #: [id number]   cc:   Dr. Drue Second

## 2010-08-29 NOTE — Assessment & Plan Note (Signed)
[facility]                              CARDIOLOGY OFFICE NOTE   ANARELY, NICHOLLS                         MRN:          [id number]  DATE:02/26/2006                            DOB:          01-Mar-1928    PRIMARY CARDIOLOGIST: Olga Millers, MD, [facility]:   SUBJECTIVE:  Sherry Henson is a 77 year old female patient followed by Dr.  Jens Som with a history of hypertension and long QT syndrome as well as  peripheral arterial disease with celiac and renal artery disease and  hyperlipidemia.  She presents to the office today with complaints of lower  extremity edema.  She went on a trip about four weeks ago for four hours.  She had some lower extremity edema after that.  This has continued since  that time.  It is bilateral.  It is worse during the day.  It is resolved  when she wakes up in the mornings.  She has been eating more salt lately.  She spoke to one of the nurses yesterday who recommended decreasing her  salt.  She also recommended elevating her legs.  This has helped somewhat.  She denies any chest pain.  Denies any pleuritic chest pain.  She denies any  shortness of breath, dyspnea on exertion, syncope, presyncope.  Denies any  orthopnea or paroxysmal nocturnal dyspnea.   MEDICATIONS:  1. Aciphex 40 mg daily.  2. Amlodipine 10 mg daily.  3. Levothyroxine 100 mcg daily.  4. Metoprolol ER 2.5 mg daily - ?? (this was apparently discontinued at      her last visit with Dr. Jens Som).  5. NIC 750 tabs.  6. Premarin 0.45 mg daily.  7. Pulmicort.  8. Simvastatin 20 mg q.h.s.  9. Singulair 10 mg daily.  10.Nasacort.  11.Aspirin 81 mg daily.  12.Biotin.  13.Citracal.  14.FiberCon.  15.Celebrex 200 mg p.r.n.   PHYSICAL EXAMINATION:  GENERAL:  She is a well-developed, well-nourished  female in no acute distress.  VITAL SIGNS:  Blood pressure 120/68, pulse 64, weight 150 pounds.  HEENT:  Unremarkable.  NECK:  Without JVD.  CARDIAC:  Normal  S1, S2.  Regular rate and rhythm.  LUNGS:  Clear to auscultation bilaterally without wheezes, rales or rhonchi.  ABDOMEN:  Soft, nontender with no organomegaly.  Normoactive bowel sounds.  EXTREMITIES:  Trace edema bilaterally.  Multiple varicosities noted.  Calves  are soft and nontender.  SKIN:  Warm and dry.  There is no erythema.  Homan's sign is negative  bilaterally.  NEUROLOGICAL:  Alert and oriented x3.  Cranial nerves II-XII are grossly  intact.   STUDIES:  Electrocardiogram reveals sinus rhythm with heart rate of 64.  Q  waves in V1-4.  No significant changes in previous tracings.  QTC 497  milliseconds.   IMPRESSION:  1. Peripheral edema.  2. Hypertension.  3. Long QT syndrome.  4. Peripheral arterial disease with celiac and renal disease.  5. Hyperlipidemia, treated.  6. Asthma.   PLAN:  The patient has complaints of lower extremity edema.  This seems to  worsen throughout the day and improve  when she is off her feet.  She has had  some salt indiscretion lately.  She has no signs of DVT.  She does have  signs of venous insufficiency.  One concern is that the recent addition and  increase in dosage of Norvasc may be contributing to her edema.  She has had  some intolerances to several antihypertensive medications in the past.  She  has been intolerant to the Toprol, Atacand as well as ace inhibitors.  I  think at this point in time I would like to see if she can tolerate the  edema with conservative therapy.  I have given her a prescription for  compression stockings.  I have asked her to continue to elevate her legs  when she is off her feet and reduce her salt intake.  If she has no  improvement in her edema in the next week or two, she is to call our office.  At that time, we can certainly consider stopping her Norvasc and switching  her to a different antihypertensive.  Some considerations would be Clonidine  or another alpha blocker.  We could also consider  Tekturna, although, she  may have intolerances to this as she did with ARB's and ACE inhibitors  (ACE  inhibitors caused cough and worsening asthma symptoms and ARBs caused leg  cramps).  She cannot take diuretics secondary to her long QT syndrome.  She  will follow up with Dr. Jens Som as scheduled.      Sherry Newcomer, PA-C  Electronically Signed      Sherry Henson. Eden Emms, MD, [facility]  Electronically Signed   SW/MedQ  DD: 02/26/2006  DT: 02/26/2006  Job #: [id number]

## 2010-08-29 NOTE — Assessment & Plan Note (Signed)
[facility]                            CARDIOLOGY OFFICE NOTE   ADISON, REIFSTECK                         MRN:          [id number]  DATE:04/21/2006                            DOB:          1927/04/25    [name] returns for followup today.  She has a history of long QT  syndrome, hypertension and peripheral vascular disease.  Since I last  saw her, she has noticed increased edema in her lower extremities.  This  correlated with increase in her Norvasc dose.  She has otherwise not had  chest pain, shortness of breath, or palpitations and no syncope.   MEDICATIONS INCLUDE:  1. AcipHex 40 mg p.o. daily.  2. Amlodipine 10 mg p.o. daily.  3. Levothyroxine 100 micrograms p.o. daily.  4. Metoprolol ER 25 mg p.o. daily.  5. Premarin.  6. Pulmicort.  7. Zocor 20 mg p.o. q.h.s.  8. Singulair.  9. Nasacort.  10.Aspirin 81 mg p.o. daily.  11.Biotin.  12.Citracal.  13.Fibercon.  14.Celebrex as needed.   PHYSICAL EXAMINATION:  Her physical exam today shows a blood pressure of  132/72 and her pulse is 69.  CHEST:  Clear.  CARDIOVASCULAR EXAM:  Reveals a regular rate and rhythm.  EXTREMITIES:  Show trace to 1+ edema.   DIAGNOSES:  1. Hypertension.  2. Long QT syndrome.  3. History of peripheral vascular disease with celiac and renal artery      disease.  4. Hyperlipidemia.  5. Asthma.   PLAN:  Mrs. Lezama has developed pedal edema and I think this is most  likely related to her Norvasc.  We will discontinue that medication.  I  will add an ARB (she did have some cramps in her legs with Atacand/HCT  in the past, but she now is not clear that this was medication-induced).  She has had a cough with ACE inhibition in the past.  We will begin  Cozaar 50 mg p.o. daily and I will check a BMET in one week.  We can  increase this further as needed.   I will see her back in eight weeks.     Madolyn Frieze Jens Som, MD, [facility]  Electronically Signed    BSC/MedQ  DD: 04/21/2006  DT: 04/21/2006  Job #: [id number]   cc:   Dalbert Mayotte, M.D.

## 2010-08-29 NOTE — Assessment & Plan Note (Signed)
[facility]                              CARDIOLOGY OFFICE NOTE   VIETTA, BONIFIELD                         MRN:          [id number]  DATE:01/27/2006                            DOB:          [birth year]    Mrs. Molinaro returns for follow-up today. She is a pleasant female who has a  history of long QT syndrome, hypertension and peripheral vascular disease.  Since I last saw her, she denies any dyspnea, chest pain, palpitations or  syncope. She has complained of fatigue since starting her Toprol. She also  has muscle aches that she feels may be related to her Zocor.   MEDICATIONS:  At present include:  1. Levothyroxine 100 mcg p.o. daily.  2. Premarin 0.45 mg p.o. q day.  3. Singulair.  4. Pulmicort.  5. Aspirin 81 mg p.o. q day.  6. Fibercon.  7. Citrucel.  8. AcipHex.  9. Zocor 40 mg p.o. q h.s.  10.Toprol 12.5 mg p.o. daily.  11.Amlodipine 5 mg p.o. q day.   PHYSICAL EXAMINATION:  Shows a blood pressure of 148/67 and her pulse is 68.  She weighs 164 pounds.  CHEST: Is clear.  CARDIOVASCULAR: Is a regular rate and rhythm.  EXTREMITIES: Show no edema.   DIAGNOSIS:  1. Hypertension.  2. History of long QT syndrome.  3. Peripheral vascular disease with celiac and renal artery disease.  4. Hyperlipidemia.  5. Asthma.   PLAN:  Mrs. Hainer is complaining of fatigue and feels that it is related  to her Toprol. We will discontinue that medication. Her blood pressure is  mildly elevated and we will increase her Norvasc to 10 mg p.o. q day. NOTE:  She has been intolerant of Atacand and now Toprol. We will adjust her  regimen as needed. She will also decrease her Zocor to 20 mg p.o. q h.s. to  see if this helps with her myalgias. She will see Dr. Samule Ohm back for her  peripheral vascular disease  in approximately one year. I will see her back in three months to review her  blood pressure and symptoms.      ______________________________  Madolyn Frieze Jens Som, MD, [facility]     BSC/MedQ  DD:  01/27/2006  DT:  01/28/2006  Job #:  [id number]   cc:   Dalbert Mayotte, M.D.

## 2010-11-27 ENCOUNTER — Encounter: Payer: Self-pay | Admitting: Cardiology

## 2010-12-23 ENCOUNTER — Other Ambulatory Visit: Payer: Self-pay | Admitting: *Deleted

## 2010-12-23 MED ORDER — PRAVASTATIN SODIUM 20 MG PO TABS: 20.0000 mg | ORAL_TABLET | Freq: Every day | ORAL | Status: DC

## 2011-04-13 ENCOUNTER — Other Ambulatory Visit: Payer: Self-pay | Admitting: Cardiology

## 2011-04-13 MED ORDER — ATENOLOL 25 MG PO TABS: 25.0000 mg | ORAL_TABLET | Freq: Two times a day (BID) | ORAL | Status: DC

## 2011-05-07 ENCOUNTER — Other Ambulatory Visit: Payer: Self-pay | Admitting: Cardiology

## 2011-06-15 ENCOUNTER — Encounter: Payer: Medicare Other | Admitting: Cardiology

## 2011-06-15 NOTE — Progress Notes (Signed)
HPI: Ms. Banker is a pleasant female who has a history of peripheral vascular disease, hypertension and long QT syndrome.  A Myoview performed on August 09, 2007 showed an ejection fraction of 81% and there was no scar or ischemia. Previous echocardiogram in April of 2007 revealed normal LV function. The left atrium was mildly dilated. There was mild mitral regurgitation.  Carotid Dopplers in Feb 2012 showed a 0-39% bilaeral stenosis; fu recommended in one year.  Renal Dopplers in Jan of 2011 revealed  a 1-59% right renal artery stenosis and the left was normal. I last saw her in fJuly 2011. Since then,   Current Outpatient Prescriptions  Medication Sig Dispense Refill  . acyclovir (ZOVIRAX) 800 MG tablet Take 800 mg by mouth as needed.        Marland Kitchen amLODipine (NORVASC) 5 MG tablet Take 5 mg by mouth daily.        Marland Kitchen aspirin 81 MG tablet Take 81 mg by mouth daily.        Marland Kitchen atenolol (TENORMIN) 25 MG tablet TAKE ONE TABLET BY MOUTH TWICE DAILY  60 tablet  0  . budesonide (PULMICORT FLEXHALER) 180 MCG/ACT inhaler Inhale 2 puffs into the lungs. In AM and PM       . dicyclomine (BENTYL) 10 MG/5ML syrup Take by mouth. 1-2 tabs before meals- 2 tab every 6       . doxycycline (VIBRAMYCIN) 100 MG capsule Take 100 mg by mouth as needed.        Marland Kitchen EPINEPHrine (EPIPEN) 0.3 mg/0.3 mL DEVI Inject 0.3 mg into the muscle as needed.        Marland Kitchen estrogens, conjugated, (PREMARIN) 0.45 MG tablet Take 0.45 mg by mouth daily. Take daily for 21 days then do not take for 7 days.       . furosemide (LASIX) 20 MG tablet Take 20 mg by mouth daily.        . hyoscyamine (HYOMAX-FT) 0.125 MG TBDP Place under the tongue as needed.        . levalbuterol (XOPENEX) 0.63 MG/3ML nebulizer solution Take 1 ampule by nebulization as needed.        Marland Kitchen levothyroxine (SYNTHROID) 100 MCG tablet Take 100 mcg by mouth daily.        Marland Kitchen losartan (COZAAR) 50 MG tablet Take one tablet by mouth once daily  30 tablet  12  . montelukast (SINGULAIR) 10 MG  tablet Take 10 mg by mouth daily.        Marland Kitchen omeprazole (PRILOSEC) 40 MG capsule Take 40 mg by mouth daily.        . potassium chloride SA (K-DUR,KLOR-CON) 20 MEQ tablet Take 20 mEq by mouth daily.        . pravastatin (PRAVACHOL) 20 MG tablet Take 1 tablet (20 mg total) by mouth at bedtime.  30 tablet  6  . Psyllium-Calcium (METAMUCIL PLUS CALCIUM PO) Take by mouth as needed.        . traMADol (ULTRAM) 50 MG tablet Take 50 mg by mouth as needed.           Past Medical History  Diagnosis Date  . Asthma   . GERD (gastroesophageal reflux disease)   . HLD (hyperlipidemia)   . HTN (hypertension)   . Hypothyroidism   . Hernia     hital  . Long Q-T syndrome   . Diverticulosis   . Arthritis   . PVD (peripheral vascular disease)     ceiac artery and  SMA stenosis, right renal artery stenosis  . Cerebrovascular disease     Past Surgical History  Procedure Date  . Bunionectomy   . Torn rotator cuff surgery   . Tonsillectomy   . Appendectomy   . Hysterectomy 9other)   . Laminectomy   . Carpel tunnel surgery     History   Social History  . Marital Status: Married    Spouse Name: N/A    Number of Children: N/A  . Years of Education: N/A   Occupational History  . Not on file.   Social History Main Topics  . Smoking status: Unknown If Ever Smoked  . Smokeless tobacco: Not on file   Comment: tobacco use- no  . Alcohol Use: Yes     occas.   . Drug Use:   . Sexually Active:    Other Topics Concern  . Not on file   Social History Narrative  . No narrative on file    ROS: no fevers or chills, productive cough, hemoptysis, dysphasia, odynophagia, melena, hematochezia, dysuria, hematuria, rash, seizure activity, orthopnea, PND, pedal edema, claudication. Remaining systems are negative.  Physical Exam: Well-developed well-nourished in no acute distress.  Skin is warm and dry.  HEENT is normal.  Neck is supple. No thyromegaly.  Chest is clear to auscultation with normal  expansion.  Cardiovascular exam is regular rate and rhythm.  Abdominal exam nontender or distended. No masses palpated. Extremities show no edema. neuro grossly intact  ECG     This encounter was created in error - please disregard.

## 2011-06-19 ENCOUNTER — Other Ambulatory Visit: Payer: Self-pay | Admitting: Cardiology

## 2011-07-08 ENCOUNTER — Encounter: Payer: Self-pay | Admitting: Cardiology

## 2011-07-08 ENCOUNTER — Ambulatory Visit (INDEPENDENT_AMBULATORY_CARE_PROVIDER_SITE_OTHER): Payer: Medicare Other | Admitting: Cardiology

## 2011-07-08 VITALS — BP 120/56 | HR 58 | Ht 63.0 in | Wt 137.1 lb

## 2011-07-08 DIAGNOSIS — I701 Atherosclerosis of renal artery: Secondary | ICD-10-CM

## 2011-07-08 DIAGNOSIS — I10 Essential (primary) hypertension: Secondary | ICD-10-CM

## 2011-07-08 DIAGNOSIS — I6529 Occlusion and stenosis of unspecified carotid artery: Secondary | ICD-10-CM

## 2011-07-08 NOTE — Assessment & Plan Note (Addendum)
Continue beta blocker. 

## 2011-07-08 NOTE — Assessment & Plan Note (Signed)
Blood pressure controlled. Continue present medications. Potassium and renal function monitored by primary care. 

## 2011-07-08 NOTE — Assessment & Plan Note (Signed)
Continue aspirin and statin. Arrange followup renal Dopplers. 

## 2011-07-08 NOTE — Progress Notes (Signed)
HPI: Ms. Lanpher is a pleasant female who has a history of peripheral vascular disease, hypertension and long QT syndrome.  A Myoview performed on August 09, 2007 showed an ejection fraction of 81% and there was no scar or ischemia. Previous echocardiogram in April of 2007 revealed normal LV function. The left atrium was mildly dilated. There was mild mitral regurgitation.  Carotid Dopplers in Jan 2012 showed a 0-39% bilateral stenosis. FU recommended one year. Renal Dopplers in Jan of 2011 revealed  a 1-59% right renal artery stenosis and the left was normal. I last saw her in July 2011. Since then, she denies dyspnea, chest pain, palpitations or syncope. She had her gallbladder removed in December.  Current Outpatient Prescriptions  Medication Sig Dispense Refill  . acyclovir (ZOVIRAX) 800 MG tablet Take 800 mg by mouth as needed.        Marland Kitchen alclomethasone (ACLOVATE) 0.05 % cream Apply 1 application topically 2 (two) times daily.      Marland Kitchen amLODipine (NORVASC) 5 MG tablet Take 5 mg by mouth daily.        Marland Kitchen aspirin 81 MG tablet Take 81 mg by mouth daily.        Marland Kitchen atenolol (TENORMIN) 25 MG tablet TAKE ONE TABLET BY MOUTH TWICE DAILY  60 tablet  0  . budesonide (PULMICORT FLEXHALER) 180 MCG/ACT inhaler Inhale 2 puffs into the lungs. In AM and PM       . cholecalciferol (VITAMIN D) 400 UNITS TABS Take 2,000 Units by mouth daily.      . Cyanocobalamin (VITAMIN B 12 PO) Take 2,000 mcg by mouth daily.      Marland Kitchen dicyclomine (BENTYL) 10 MG/5ML syrup Take by mouth. 1-2 tabs before meals- 2 tab every 6       . estrogens, conjugated, (PREMARIN) 0.45 MG tablet Take 0.45 mg by mouth daily. Take daily for 21 days then do not take for 7 days.       . furosemide (LASIX) 20 MG tablet Take 20 mg by mouth daily.        . hyoscyamine (HYOMAX-FT) 0.125 MG TBDP Place under the tongue as needed.        . levalbuterol (XOPENEX) 0.63 MG/3ML nebulizer solution Take 1 ampule by nebulization as needed.        Marland Kitchen levothyroxine  (SYNTHROID) 100 MCG tablet Take 100 mcg by mouth daily.        Marland Kitchen loratadine (CLARITIN) 10 MG tablet Take 10 mg by mouth daily.      Marland Kitchen losartan (COZAAR) 50 MG tablet Take one tablet by mouth once daily  30 tablet  12  . metroNIDAZOLE (METROCREAM) 0.75 % cream Apply 1 application topically 2 (two) times daily.      . montelukast (SINGULAIR) 10 MG tablet Take 10 mg by mouth daily.        Marland Kitchen omeprazole (PRILOSEC) 40 MG capsule Take 40 mg by mouth daily.        . potassium chloride SA (K-DUR,KLOR-CON) 20 MEQ tablet Take 20 mEq by mouth daily.        . pravastatin (PRAVACHOL) 20 MG tablet Take 1 tablet (20 mg total) by mouth at bedtime.  30 tablet  6  . Probiotic Product (PROBIOTIC & ACIDOPHILUS EX ST) CAPS Take by mouth daily.      . Psyllium-Calcium (METAMUCIL PLUS CALCIUM PO) Take by mouth as needed.        . traMADol (ULTRAM) 50 MG tablet Take 50 mg by mouth  as needed.           Past Medical History  Diagnosis Date  . Asthma   . GERD (gastroesophageal reflux disease)   . HLD (hyperlipidemia)   . HTN (hypertension)   . Hypothyroidism   . Hernia     hital  . Long Q-T syndrome   . Diverticulosis   . Arthritis   . PVD (peripheral vascular disease)     ceiac artery and SMA stenosis, right renal artery stenosis  . Cerebrovascular disease     Past Surgical History  Procedure Date  . Bunionectomy   . Torn rotator cuff surgery   . Tonsillectomy   . Appendectomy   . Hysterectomy 9other)   . Laminectomy   . Carpel tunnel surgery     History   Social History  . Marital Status: Married    Spouse Name: N/A    Number of Children: N/A  . Years of Education: N/A   Occupational History  . Not on file.   Social History Main Topics  . Smoking status: Never Smoker   . Smokeless tobacco: Not on file   Comment: tobacco use- no  . Alcohol Use: Yes     occas.   . Drug Use: Not on file  . Sexually Active: Not on file   Other Topics Concern  . Not on file   Social History Narrative   . No narrative on file    ROS: Some sinusitis but no hemoptysis, dysphasia, odynophagia, melena, hematochezia, dysuria, hematuria, rash, seizure activity, orthopnea, PND, pedal edema, claudication. Remaining systems are negative.  Physical Exam: Well-developed well-nourished in no acute distress.  Skin is warm and dry.  HEENT is normal.  Neck is supple. No thyromegaly.  Chest is clear to auscultation with normal expansion.  Cardiovascular exam is regular rate and rhythm. 1/6 systolic ejection murmur Abdominal exam nontender or distended. No masses palpated. Extremities show no edema. neuro grossly intact  ECG sinus rhythm at a rate of 58. Left axis deviation. Cannot rule out prior septal infarct.

## 2011-07-08 NOTE — Progress Notes (Signed)
Addended by: Freddi Starr on: 07/08/2011 02:31 PM   Modules accepted: Orders

## 2011-07-08 NOTE — Assessment & Plan Note (Signed)
Continue statin. Lipids and liver monitored by primary care. 

## 2011-07-08 NOTE — Patient Instructions (Signed)
Your physician wants you to follow-up in: ONE YEAR You will receive a reminder letter in the mail two months in advance. If you don't receive a letter, please call our office to schedule the follow-up appointment.   Your physician has requested that you have a carotid duplex. This test is an ultrasound of the carotid arteries in your neck. It looks at blood flow through these arteries that supply the brain with blood. Allow one hour for this exam. There are no restrictions or special instructions.   Your physician has requested that you have a renal artery duplex. During this test, an ultrasound is used to evaluate blood flow to the kidneys. Allow one hour for this exam. Do not eat after midnight the day before and avoid carbonated beverages. Take your medications as you usually do.

## 2011-07-08 NOTE — Assessment & Plan Note (Signed)
Continue aspirin and statin. Arrange followup carotid Dopplers. 

## 2011-07-17 ENCOUNTER — Encounter: Payer: Self-pay | Admitting: Cardiology

## 2011-07-29 ENCOUNTER — Other Ambulatory Visit: Payer: Self-pay | Admitting: Cardiology

## 2011-08-21 ENCOUNTER — Other Ambulatory Visit: Payer: Self-pay | Admitting: Cardiology

## 2011-09-13 ENCOUNTER — Other Ambulatory Visit: Payer: Self-pay | Admitting: Cardiology

## 2011-09-24 ENCOUNTER — Encounter: Payer: Self-pay | Admitting: Cardiology

## 2011-09-25 ENCOUNTER — Other Ambulatory Visit: Payer: Self-pay | Admitting: Cardiology

## 2012-08-17 ENCOUNTER — Other Ambulatory Visit: Payer: Self-pay | Admitting: Cardiology

## 2012-08-31 ENCOUNTER — Ambulatory Visit (INDEPENDENT_AMBULATORY_CARE_PROVIDER_SITE_OTHER): Payer: Medicare Other | Admitting: Cardiology

## 2012-08-31 ENCOUNTER — Encounter (INDEPENDENT_AMBULATORY_CARE_PROVIDER_SITE_OTHER): Payer: Medicare Other | Admitting: Cardiology

## 2012-08-31 ENCOUNTER — Encounter: Payer: Self-pay | Admitting: Cardiology

## 2012-08-31 VITALS — BP 130/80 | HR 60 | Wt 130.0 lb

## 2012-08-31 DIAGNOSIS — R0600 Dyspnea, unspecified: Secondary | ICD-10-CM | POA: Insufficient documentation

## 2012-08-31 DIAGNOSIS — I701 Atherosclerosis of renal artery: Secondary | ICD-10-CM

## 2012-08-31 DIAGNOSIS — I10 Essential (primary) hypertension: Secondary | ICD-10-CM

## 2012-08-31 DIAGNOSIS — E7800 Pure hypercholesterolemia, unspecified: Secondary | ICD-10-CM

## 2012-08-31 DIAGNOSIS — R0609 Other forms of dyspnea: Secondary | ICD-10-CM

## 2012-08-31 DIAGNOSIS — I2510 Atherosclerotic heart disease of native coronary artery without angina pectoris: Secondary | ICD-10-CM

## 2012-08-31 DIAGNOSIS — Q898 Other specified congenital malformations: Secondary | ICD-10-CM

## 2012-08-31 NOTE — Patient Instructions (Signed)

## 2012-08-31 NOTE — Assessment & Plan Note (Signed)
Continue beta blocker. 

## 2012-08-31 NOTE — Assessment & Plan Note (Signed)
Continue aspirin and statin. 

## 2012-08-31 NOTE — Assessment & Plan Note (Addendum)
Not volume overloaded on examination. Question related to asthma. Plan echocardiogram to assess LV function. Patient had a stress test in [facility] in December of 2012.we will have those results sent to [place] for our records.

## 2012-08-31 NOTE — Assessment & Plan Note (Signed)
Continue present medications. Blood pressure controlled. Potassium and renal function monitored by primary care.

## 2012-08-31 NOTE — Assessment & Plan Note (Signed)
Continue statin. Lipids and liver monitored by primary care. 

## 2012-08-31 NOTE — Progress Notes (Signed)
HPI: Sherry Henson is a pleasant female who has a history of peripheral vascular disease, hypertension and long QT syndrome. Previous echocardiogram in April of 2007 revealed normal LV function. The left atrium was mildly dilated. There was mild mitral regurgitation. A Myoview performed on August 09, 2007 showed an ejection fraction of 81% and there was no scar or ischemia. Carotid Dopplers in April 2013 showed mild plaque but no significant carotid stenosis bilaterally. Renal Dopplers in April 2013 revealed a borderline velocity for >60% right renal artery stenosis; no left renal artery stenosis. I last saw her in March of 2013. Since then, she has some fatigue. She has some dyspnea which she attributes to asthma. Question orthopnea although improves with inhalers. No pedal edema. No chest pain or syncope.  Current Outpatient Prescriptions  Medication Sig Dispense Refill  . acyclovir (ZOVIRAX) 800 MG tablet Take 800 mg by mouth as needed.        Marland Kitchen alclomethasone (ACLOVATE) 0.05 % cream Apply 1 application topically 2 (two) times daily.      Marland Kitchen amLODipine (NORVASC) 5 MG tablet TAKE ONE TABLET BY MOUTH ONCE DAILY  30 tablet  11  . aspirin 81 MG tablet Take 81 mg by mouth daily.        Marland Kitchen atenolol (TENORMIN) 25 MG tablet TAKE ONE TABLET BY MOUTH TWICE DAILY  60 tablet  11  . budesonide (PULMICORT FLEXHALER) 180 MCG/ACT inhaler Inhale 2 puffs into the lungs. In AM and PM       . estrogens, conjugated, (PREMARIN) 0.45 MG tablet Take 0.45 mg by mouth daily. Take daily for 21 days then do not take for 7 days.       . furosemide (LASIX) 20 MG tablet Take 20 mg by mouth daily.        Marland Kitchen levalbuterol (XOPENEX) 0.63 MG/3ML nebulizer solution Take 1 ampule by nebulization as needed.        Marland Kitchen levothyroxine (SYNTHROID) 100 MCG tablet Take 100 mcg by mouth daily.        Marland Kitchen losartan (COZAAR) 50 MG tablet TAKE ONE TABLET BY MOUTH ONCE DAILY  30 tablet  11  . montelukast (SINGULAIR) 10 MG tablet Take 10 mg by mouth  daily.        Marland Kitchen omeprazole (PRILOSEC) 40 MG capsule Take 40 mg by mouth daily.        . potassium chloride SA (K-DUR,KLOR-CON) 20 MEQ tablet Take 20 mEq by mouth daily.        . pravastatin (PRAVACHOL) 20 MG tablet TAKE ONE TABLET BY MOUTH NIGHTLY AT BEDTIME  30 tablet  11  . Probiotic Product (PROBIOTIC & ACIDOPHILUS EX ST) CAPS Take by mouth daily.      . traMADol (ULTRAM) 50 MG tablet Take 50 mg by mouth as needed.         No current facility-administered medications for this visit.     Past Medical History  Diagnosis Date  . Asthma   . GERD (gastroesophageal reflux disease)   . HLD (hyperlipidemia)   . HTN (hypertension)   . Hypothyroidism   . Hernia     hital  . Long Q-T syndrome   . Diverticulosis   . Arthritis   . PVD (peripheral vascular disease)     ceiac artery and SMA stenosis, right renal artery stenosis  . Cerebrovascular disease     Past Surgical History  Procedure Laterality Date  . Bunionectomy    . Torn rotator cuff surgery    .  Tonsillectomy    . Appendectomy    . Hysterectomy 9other)    . Laminectomy    . Carpel tunnel surgery      History   Social History  . Marital Status: Married    Spouse Name: N/A    Number of Children: N/A  . Years of Education: N/A   Occupational History  . Not on file.   Social History Main Topics  . Smoking status: Never Smoker   . Smokeless tobacco: Not on file     Comment: tobacco use- no  . Alcohol Use: Yes     Comment: occas.   . Drug Use: Not on file  . Sexually Active: Not on file   Other Topics Concern  . Not on file   Social History Narrative  . No narrative on file    ROS: fatigue but no fevers or chills, productive cough, hemoptysis, dysphasia, odynophagia, melena, hematochezia, dysuria, hematuria, rash, seizure activity, orthopnea, PND, pedal edema, claudication. Remaining systems are negative.  Physical Exam: Well-developed well-nourished in no acute distress.  Skin is warm and dry.  HEENT  is normal.  Neck is supple.  Chest is clear to auscultation with normal expansion.  Cardiovascular exam is regular rate and rhythm. 1/6 systolic murmur left sternal border. Abdominal exam nontender or distended. No masses palpated. Extremities show no edema. neuro grossly intact  ECG sinus rhythm at a rate of 60. First degree AV block. Prior septal and inferior infarct. Left axis deviation. Left ventricular hypertrophy.

## 2012-08-31 NOTE — Progress Notes (Addendum)
HPI: Sherry Henson is a pleasant female who has a history of peripheral vascular disease, hypertension and long QT syndrome. Previous echocardiogram in April of 2007 revealed normal LV function. The left atrium was mildly dilated. There was mild mitral regurgitation. A Myoview performed on August 09, 2007 showed an ejection fraction of 81% and there was no scar or ischemia. Carotid Dopplers in April 2013 showed mild plaque but no significant carotid stenosis bilaterally. Renal Dopplers in April 2013 revealed a borderline velocity for >60% right renal artery stenosis; no left renal artery stenosis. I last saw her in March of 2013. Since then, she denies dyspnea, chest pain, palpitations or syncope. She had her gallbladder removed in December.   Current Outpatient Prescriptions  Medication Sig Dispense Refill  . acyclovir (ZOVIRAX) 800 MG tablet Take 800 mg by mouth as needed.        Marland Kitchen alclomethasone (ACLOVATE) 0.05 % cream Apply 1 application topically 2 (two) times daily.      Marland Kitchen amLODipine (NORVASC) 5 MG tablet TAKE ONE TABLET BY MOUTH ONCE DAILY  30 tablet  11  . aspirin 81 MG tablet Take 81 mg by mouth daily.        Marland Kitchen atenolol (TENORMIN) 25 MG tablet TAKE ONE TABLET BY MOUTH TWICE DAILY  60 tablet  11  . budesonide (PULMICORT FLEXHALER) 180 MCG/ACT inhaler Inhale 2 puffs into the lungs. In AM and PM       . cholecalciferol (VITAMIN D) 400 UNITS TABS Take 2,000 Units by mouth daily.      . Cyanocobalamin (VITAMIN B 12 PO) Take 2,000 mcg by mouth daily.      Marland Kitchen dicyclomine (BENTYL) 10 MG/5ML syrup Take by mouth. 1-2 tabs before meals- 2 tab every 6       . estrogens, conjugated, (PREMARIN) 0.45 MG tablet Take 0.45 mg by mouth daily. Take daily for 21 days then do not take for 7 days.       . furosemide (LASIX) 20 MG tablet Take 20 mg by mouth daily.        . hyoscyamine (HYOMAX-FT) 0.125 MG TBDP Place under the tongue as needed.        . levalbuterol (XOPENEX) 0.63 MG/3ML nebulizer solution Take 1  ampule by nebulization as needed.        Marland Kitchen levothyroxine (SYNTHROID) 100 MCG tablet Take 100 mcg by mouth daily.        Marland Kitchen loratadine (CLARITIN) 10 MG tablet Take 10 mg by mouth daily.      Marland Kitchen losartan (COZAAR) 50 MG tablet TAKE ONE TABLET BY MOUTH ONCE DAILY  30 tablet  11  . metroNIDAZOLE (METROCREAM) 0.75 % cream Apply 1 application topically 2 (two) times daily.      . montelukast (SINGULAIR) 10 MG tablet Take 10 mg by mouth daily.        Marland Kitchen omeprazole (PRILOSEC) 40 MG capsule Take 40 mg by mouth daily.        . potassium chloride SA (K-DUR,KLOR-CON) 20 MEQ tablet Take 20 mEq by mouth daily.        . pravastatin (PRAVACHOL) 20 MG tablet TAKE ONE TABLET BY MOUTH NIGHTLY AT BEDTIME  30 tablet  11  . Probiotic Product (PROBIOTIC & ACIDOPHILUS EX ST) CAPS Take by mouth daily.      . Psyllium-Calcium (METAMUCIL PLUS CALCIUM PO) Take by mouth as needed.        . traMADol (ULTRAM) 50 MG tablet Take 50 mg by mouth as  needed.         No current facility-administered medications for this visit.     Past Medical History  Diagnosis Date  . Asthma   . GERD (gastroesophageal reflux disease)   . HLD (hyperlipidemia)   . HTN (hypertension)   . Hypothyroidism   . Hernia     hital  . Long Q-T syndrome   . Diverticulosis   . Arthritis   . PVD (peripheral vascular disease)     ceiac artery and SMA stenosis, right renal artery stenosis  . Cerebrovascular disease     Past Surgical History  Procedure Laterality Date  . Bunionectomy    . Torn rotator cuff surgery    . Tonsillectomy    . Appendectomy    . Hysterectomy 9other)    . Laminectomy    . Carpel tunnel surgery      History   Social History  . Marital Status: Married    Spouse Name: N/A    Number of Children: N/A  . Years of Education: N/A   Occupational History  . Not on file.   Social History Main Topics  . Smoking status: Never Smoker   . Smokeless tobacco: Not on file     Comment: tobacco use- no  . Alcohol Use: Yes      Comment: occas.   . Drug Use: Not on file  . Sexually Active: Not on file   Other Topics Concern  . Not on file   Social History Narrative  . No narrative on file    ROS: no fevers or chills, productive cough, hemoptysis, dysphasia, odynophagia, melena, hematochezia, dysuria, hematuria, rash, seizure activity, orthopnea, PND, pedal edema, claudication. Remaining systems are negative.  Physical Exam: Well-developed well-nourished in no acute distress.  Skin is warm and dry.  HEENT is normal.  Neck is supple.  Chest is clear to auscultation with normal expansion.  Cardiovascular exam is regular rate and rhythm.  Abdominal exam nontender or distended. No masses palpated. Extremities show no edema. neuro grossly intact  ECG     This encounter was created in error - please disregard.

## 2012-09-01 ENCOUNTER — Telehealth: Payer: Self-pay | Admitting: Cardiology

## 2012-09-01 NOTE — Telephone Encounter (Signed)
ROI faxed to [facility] at [phone]  09/01/12/KM

## 2012-09-01 NOTE — Telephone Encounter (Signed)
Records Rec From [facility], gave to [facility] 09/01/12/KM

## 2012-09-08 ENCOUNTER — Ambulatory Visit (HOSPITAL_COMMUNITY): Payer: Medicare Other | Attending: Cardiology | Admitting: Radiology

## 2012-09-08 DIAGNOSIS — I2510 Atherosclerotic heart disease of native coronary artery without angina pectoris: Secondary | ICD-10-CM | POA: Insufficient documentation

## 2012-09-08 NOTE — Progress Notes (Signed)
Echocardiogram performed by Joyce Partee.  

## 2012-10-08 ENCOUNTER — Other Ambulatory Visit: Payer: Self-pay | Admitting: Cardiovascular Disease

## 2012-12-16 ENCOUNTER — Other Ambulatory Visit: Payer: Self-pay

## 2012-12-16 MED ORDER — LOSARTAN POTASSIUM 50 MG PO TABS: ORAL_TABLET | ORAL | Status: DC

## 2013-08-23 ENCOUNTER — Encounter: Payer: Self-pay | Admitting: Cardiology

## 2013-08-23 ENCOUNTER — Ambulatory Visit (INDEPENDENT_AMBULATORY_CARE_PROVIDER_SITE_OTHER): Payer: Medicare Other | Admitting: Cardiology

## 2013-08-23 VITALS — BP 132/70 | HR 62 | Ht 63.0 in | Wt 134.0 lb

## 2013-08-23 DIAGNOSIS — I10 Essential (primary) hypertension: Secondary | ICD-10-CM

## 2013-08-23 DIAGNOSIS — I701 Atherosclerosis of renal artery: Secondary | ICD-10-CM

## 2013-08-23 DIAGNOSIS — Q898 Other specified congenital malformations: Secondary | ICD-10-CM

## 2013-08-23 DIAGNOSIS — E7800 Pure hypercholesterolemia, unspecified: Secondary | ICD-10-CM

## 2013-08-23 DIAGNOSIS — I6529 Occlusion and stenosis of unspecified carotid artery: Secondary | ICD-10-CM

## 2013-08-23 NOTE — Assessment & Plan Note (Signed)
Continue beta blocker. No history of syncope.

## 2013-08-23 NOTE — Assessment & Plan Note (Signed)
Continue aspirin and statin. 

## 2013-08-23 NOTE — Progress Notes (Signed)
HPI: [name] is a pleasant female who has a history of peripheral vascular disease, hypertension and long QT syndrome. Previous echocardiogram in April of 2007 revealed normal LV function. The left atrium was mildly dilated. There was mild mitral regurgitation. A Myoview performed on August 09, 2007 showed an ejection fraction of 81% and there was no scar or ischemia. Carotid Dopplers in April 2013 showed mild plaque but no significant carotid stenosis bilaterally. Renal Dopplers in April 2013 revealed a borderline velocity for >60% right renal artery stenosis; no left renal artery stenosis. Echocardiogram in May 2014 showed an ejection fraction of 50-55% and grade 1 diastolic dysfunction. Patient apparently had negative nuclear study in May of 2013 in [place]. Records are not available. I last saw her in May 2014.Since then, She has some dyspnea on exertion which she attributes to asthma. No orthopnea or PND. Minimal pedal edema. No chest pain or syncope.   Current Outpatient Prescriptions  Medication Sig Dispense Refill  . aspirin 81 MG tablet Take 81 mg by mouth daily.        Marland Kitchen. atenolol (TENORMIN) 25 MG tablet TAKE ONE TABLET BY MOUTH TWICE DAILY  60 tablet  11  . estrogens, conjugated, (PREMARIN) 0.45 MG tablet Take 0.45 mg by mouth daily. Take daily for 21 days then do not take for 7 days.       . furosemide (LASIX) 20 MG tablet Take 20 mg by mouth daily.        . hydroquinone 4 % cream Apply 1 application topically 2 (two) times daily.      Marland Kitchen. levalbuterol (XOPENEX) 0.63 MG/3ML nebulizer solution Take 1 ampule by nebulization as needed.        Marland Kitchen. levothyroxine (SYNTHROID) 100 MCG tablet Take 100 mcg for 6 days then take 1/2 tablet on the 7th day      . losartan (COZAAR) 100 MG tablet Take 100 mg by mouth daily.      . metroNIDAZOLE (METROCREAM) 0.75 % cream Apply 1 application topically 2 (two) times daily.      . mometasone-formoterol (DULERA) 200-5 MCG/ACT AERO Inhale 2 puffs  into the lungs 2 (two) times daily.      . montelukast (SINGULAIR) 10 MG tablet Take 10 mg by mouth daily.        Marland Kitchen. omeprazole (PRILOSEC) 40 MG capsule Take 40 mg by mouth daily.        . polyethylene glycol (MIRALAX / GLYCOLAX) packet Take 17 g by mouth as needed.      . potassium chloride SA (K-DUR,KLOR-CON) 20 MEQ tablet Take 20 mEq by mouth daily.        . Probiotic Product (PROBIOTIC & ACIDOPHILUS EX ST) CAPS Take 1 capsule by mouth daily.       . traMADol (ULTRAM) 50 MG tablet Take 50 mg by mouth 3 (three) times daily as needed.       . pravastatin (PRAVACHOL) 20 MG tablet TAKE ONE TABLET BY MOUTH NIGHTLY AT BEDTIME  30 tablet  11   No current facility-administered medications for this visit.     Past Medical History  Diagnosis Date  . Asthma   . GERD (gastroesophageal reflux disease)   . HLD (hyperlipidemia)   . HTN (hypertension)   . Hypothyroidism   . Hernia     hital  . Long Q-T syndrome   . Diverticulosis   . Arthritis   . PVD (peripheral vascular disease)     ceiac artery  and SMA stenosis, right renal artery stenosis  . Cerebrovascular disease     Past Surgical History  Procedure Laterality Date  . Bunionectomy    . Torn rotator cuff surgery    . Tonsillectomy    . Appendectomy    . Hysterectomy 9other)    . Laminectomy    . Carpel tunnel surgery      History   Social History  . Marital Status: Married    Spouse Name: N/A    Number of Children: N/A  . Years of Education: N/A   Occupational History  . Not on file.   Social History Main Topics  . Smoking status: Never Smoker   . Smokeless tobacco: Not on file     Comment: tobacco use- no  . Alcohol Use: Yes     Comment: occas.   . Drug Use: Not on file  . Sexual Activity: Not on file   Other Topics Concern  . Not on file   Social History Narrative  . No narrative on file    ROS: Fatigue and bilateral lower chimney cramping but no fevers or chills, productive cough, hemoptysis, dysphasia,  odynophagia, melena, hematochezia, dysuria, hematuria, rash, seizure activity, orthopnea, PND, pedal edema, claudication. Remaining systems are negative.  Physical Exam: Well-developed well-nourished in no acute distress.  Skin is warm and dry.  HEENT is normal.  Neck is supple.  Chest is clear to auscultation with normal expansion.  Cardiovascular exam is regular rate and rhythm.  Abdominal exam nontender or distended. No masses palpated. Extremities show no edema. neuro grossly intact  ECG Sinus rhythm at a rate of 61. First degree AV block. Prior septal infarct. Prolonged QT interval.

## 2013-08-23 NOTE — Assessment & Plan Note (Signed)
Blood pressure controlled. Continue present medications. 

## 2013-08-23 NOTE — Patient Instructions (Signed)
Your physician wants you to follow-up in: ONE YEAR WITH DR CRENSHAW You will receive a reminder letter in the mail two months in advance. If you don't receive a letter, please call our office to schedule the follow-up appointment.  

## 2013-08-23 NOTE — Assessment & Plan Note (Signed)
Continue statin. Lipids and liver monitored by primary care. 

## 2013-09-09 ENCOUNTER — Other Ambulatory Visit: Payer: Self-pay | Admitting: Cardiology

## 2014-10-06 ENCOUNTER — Other Ambulatory Visit: Payer: Self-pay | Admitting: Cardiology

## 2014-11-15 ENCOUNTER — Other Ambulatory Visit: Payer: Self-pay | Admitting: Cardiology

## 2014-12-16 ENCOUNTER — Other Ambulatory Visit: Payer: Self-pay | Admitting: Cardiology

## 2015-02-09 ENCOUNTER — Other Ambulatory Visit: Payer: Self-pay | Admitting: Cardiology

## 2015-03-14 ENCOUNTER — Encounter: Payer: Self-pay | Admitting: Physical Therapy

## 2015-03-14 ENCOUNTER — Ambulatory Visit (INDEPENDENT_AMBULATORY_CARE_PROVIDER_SITE_OTHER): Payer: Medicare Other | Admitting: Physical Therapy

## 2015-03-14 DIAGNOSIS — R269 Unspecified abnormalities of gait and mobility: Secondary | ICD-10-CM | POA: Diagnosis not present

## 2015-03-14 DIAGNOSIS — R531 Weakness: Secondary | ICD-10-CM | POA: Diagnosis not present

## 2015-03-14 DIAGNOSIS — M2560 Stiffness of unspecified joint, not elsewhere classified: Secondary | ICD-10-CM

## 2015-03-14 DIAGNOSIS — M25572 Pain in left ankle and joints of left foot: Secondary | ICD-10-CM

## 2015-03-14 NOTE — Patient Instructions (Signed)
Leg Lengthener: Full    Straighten one leg. Pull toes AND forefoot toward knee, extend heel. Lengthen leg by pulling pelvis away from ribs. Hold _5__ seconds. Relax. Repeat 1 time. Re-bend knee. Do other leg. Each leg __10_ times. Repeat on the other leg.  Surface: floor    Ankle Pump    With left leg elevated, gently flex and extend ankle. Move through full range of motion. Avoid pain. Repeat _10___ times per set. Do __1__ sets per session. Do __3-4__ sessions per day.  Gastroc, Sitting (Passive)    Sit with strap or towel around ball of foot. Gently pull toward body. Hold _30__ seconds.  Repeat on the other leg.  Repeat __1_ times per session. Do _3-4__ sessions per day.  Copyright  VHI. All rights reserved.

## 2015-03-14 NOTE — Therapy (Signed)
[facility] [place] [address] [place], [place], [address] Phone: [phone]   Fax:  [phone]  Physical Therapy Evaluation  Patient Details  Name: Sherry Henson MRN: [id number] Date of Birth: 09-30-1927 Referring Provider: Dr Jonny Ruiz hewitt  Encounter Date: 03/14/2015      PT End of Session - 03/14/15 1851    Visit Number 1   Number of Visits 8   Date for PT Re-Evaluation 04/11/15   PT Start Time 1448   PT Stop Time 1541   PT Time Calculation (min) 53 min   Activity Tolerance Patient limited by pain      Past Medical History  Diagnosis Date  . Asthma   . GERD (gastroesophageal reflux disease)   . HLD (hyperlipidemia)   . HTN (hypertension)   . Hypothyroidism   . Hernia     hital  . Long Q-T syndrome   . Diverticulosis   . Arthritis   . PVD (peripheral vascular disease) (HCC)     ceiac artery and SMA stenosis, right renal artery stenosis  . Cerebrovascular disease     Past Surgical History  Procedure Laterality Date  . Bunionectomy    . Torn rotator cuff surgery    . Tonsillectomy    . Appendectomy    . Hysterectomy 9other)    . Laminectomy    . Carpel tunnel surgery      There were no vitals filed for this visit.  Visit Diagnosis:  Stiffness of multiple joints - Plan: PT plan of care cert/re-cert  Pain in joint, ankle and foot, left - Plan: PT plan of care cert/re-cert  Abnormal gait - Plan: PT plan of care cert/re-cert  Weakness generalized - Plan: PT plan of care cert/re-cert      Subjective Assessment - 03/14/15 1456    Subjective Pt reports she had surgery on her Lt foot in 1968 and has had issues with her feet since then. She decided this past August that she needed to see the MD about the pain. She initially got orthics without relief, she got new shoes 2-3 months ago. She has an insert of padding to make it more comforatable to walk  she reports these help alittle.    Pertinent History bilat  RTC surgery 5 yrs ago, bunionectomy Lt 1968, injections in bilat knees in August 2016    How long can you sit comfortably? OK   How long can you stand comfortably? immediate pain with standing   How long can you walk comfortably? household ambulation only   Diagnostic tests x-rays (-) for fractures.    Patient Stated Goals pt wishes to be able to walk without limping and have a regular stride, do things with out pain.    Currently in Pain? Yes   Pain Score 2   at rest, significant increase with weightbearing   Pain Location Foot   Pain Orientation Left   Pain Descriptors / Indicators Stabbing   Pain Radiating Towards under the great toe.    Aggravating Factors  walking   Pain Relieving Factors new shoes with cusion, tramadol, heat            [facility] PT Assessment - 03/14/15 0001    Assessment   Medical Diagnosis Lt foot pain with callous under great toe   Referring Provider Dr Jonny Ruiz hewitt   Onset Date/Surgical Date 11/12/14   Hand Dominance Right   Next MD Visit after PT   Prior Therapy none   Precautions  Precautions None   Balance Screen   Has the patient fallen in the past 6 months Yes   How many times? 1  she was carrying laundry and tripped on a step   Has the patient had a decrease in activity level because of a fear of falling?  Yes  due to pain   Is the patient reluctant to leave their home because of a fear of falling?  Yes   Home Environment   Living Environment Private residence   Living Arrangements Spouse/significant other   Home Access Stairs to enter   Home Layout One level   Prior Function   Level of Independence Independent   Vocation Retired   Leisure [employer] and coins  she would like to do more however her foot pain limits this    Observation/Other Assessments   Focus on Therapeutic Outcomes (FOTO)  70% limited   Functional Tests   Functional tests Single leg stance   Single Leg Stance   Comments pt fearfull and not willing to try SLS or  heel raise.    Posture/Postural Control   Posture Comments ASIS even   second Lt toe longer than first.    ROM / Strength   AROM / PROM / Strength AROM;Strength   AROM   Overall AROM Comments Lt great toe extension 40 degrees.    AROM Assessment Site Knee;Hip;Ankle   Right/Left Hip --  bilat WFL   Right/Left Knee Right;Left   Right Knee Extension -5   Left Knee Extension -7   Right/Left Ankle Right;Left   Right Ankle Dorsiflexion 5  PROM 7   Left Ankle Dorsiflexion 2  PROM 5   Strength   Strength Assessment Site Knee;Ankle;Hip   Right/Left Hip Right;Left   Right Hip Flexion 5/5   Right Hip ABduction 4+/5   Left Hip Flexion 5/5   Left Hip ABduction 4/5   Right/Left Knee --  bilat WNL   Right/Left Ankle Left;Right   Right Ankle Dorsiflexion 4/5   Right Ankle Plantar Flexion 3/5   Right Ankle Inversion 5/5   Right Ankle Eversion 5/5   Left Ankle Dorsiflexion 4+/5   Left Ankle Plantar Flexion 3/5   Left Ankle Inversion 5/5   Left Ankle Eversion 5/5   Ambulation/Gait   Ambulation/Gait --  observed in the clinic   Gait Pattern Left flexed knee in stance;Right flexed knee in stance;Shuffle;Trunk flexed;Poor foot clearance - left;Poor foot clearance - right                   [facility] Adult PT Treatment/Exercise - 03/14/15 0001    Exercises   Exercises Ankle   Ankle Exercises: Stretches   Gastroc Stretch 30 seconds  bilat, towel stretch seated   Ankle Exercises: Seated   Ankle Circles/Pumps Left;10 reps   Other Seated Ankle Exercises 10 reps leg lengthener bilat.                 PT Education - 03/14/15 1851    Education provided Yes   Education Details HEP   Person(s) Educated Patient   Methods Explanation;Handout   Comprehension Returned demonstration             PT Long Term Goals - 03/14/15 1855    PT LONG TERM GOAL #1   Title I with HEP (04/11/15)    Time 4   Period Weeks   Status New   PT LONG TERM GOAL #2   Title increase bilat  ankle dorsiflexion =/>  10 degrees without pain ( 04/11/15)    Time 4   Period Weeks   Status New   PT LONG TERM GOAL #3   Title demo bilat knee and hip extension WNL ( 04/11/15)    Time 4   Period Weeks   Status New   PT LONG TERM GOAL #4   Title increase ankle strength =/> 4+/5 (04/11/15)    Time 4   Period Weeks   Status New   PT LONG TERM GOAL #5   Title increase bilat hip abduction strength =/> 5-/5 (04/11/15)    Time 4   Period Weeks   Status New   PT LONG TERM GOAL #6   Title improve FOTO =/< 56% limited CK level (04/11/15)   Time 4   Period Weeks   Status New               Plan - 03/14/15 1852    Clinical Impression Statement 81 yo female presents with h/o Lt foot pain that has begun to limit her physical activity more and more.  She is now relucant to leave the home and is considering using an assistive device for safety with ambulation.  She is living a more sedentary lifestyle and due to this has developed tightness in bilat hip flexors, knee flexors and ankles.  She also has some LE weakness and pain in her Lt foot that limit mobility.    Pt will benefit from skilled therapeutic intervention in order to improve on the following deficits Abnormal gait;Decreased range of motion;Difficulty walking;Decreased activity tolerance;Pain;Decreased strength;Postural dysfunction   Rehab Potential Good   PT Frequency 2x / week   PT Duration 4 weeks  until she sees her MD   PT Treatment/Interventions Manual techniques;Therapeutic exercise;Moist Heat;Electrical Stimulation;Cryotherapy;Dry needling;Passive range of motion;Patient/family education;Gait training;Neuromuscular re-education;Ultrasound   PT Next Visit Plan LE strengthening, ankle/knee/hip stretching    Consulted and Agree with Plan of Care Patient         Problem List Patient Active Problem List   Diagnosis Date Noted  . Dyspnea 08/31/2012  . SYNCOPE 11/21/2008  . HYPERCHOLESTEROLEMIA, PURE 05/01/2008  .  HYPERTENSION, BENIGN 05/01/2008  . CAROTID ARTERY STENOSIS, WITHOUT INFARCTION 05/01/2008  . RENAL ATHEROSCLEROSIS 05/01/2008  . PVD 05/01/2008  . LONG QT SYNDROME 05/01/2008    Roderic Scarce PT 03/14/2015, 7:04 PM  [facility] [address] [place], [place], [address] Phone: [phone]   Fax:  [phone]  Name: Sherry Henson MRN: [id number] Date of Birth: 1927/10/16

## 2015-03-18 ENCOUNTER — Ambulatory Visit (INDEPENDENT_AMBULATORY_CARE_PROVIDER_SITE_OTHER): Payer: Medicare Other | Admitting: Physical Therapy

## 2015-03-18 DIAGNOSIS — M25572 Pain in left ankle and joints of left foot: Secondary | ICD-10-CM

## 2015-03-18 DIAGNOSIS — M2560 Stiffness of unspecified joint, not elsewhere classified: Secondary | ICD-10-CM | POA: Diagnosis not present

## 2015-03-18 DIAGNOSIS — R531 Weakness: Secondary | ICD-10-CM | POA: Diagnosis not present

## 2015-03-18 DIAGNOSIS — R269 Unspecified abnormalities of gait and mobility: Secondary | ICD-10-CM

## 2015-03-18 NOTE — Therapy (Signed)
[facility] [place] [address] [place], [place], [address] Phone: [phone]   Fax:  [phone]  Physical Therapy Treatment  Patient Details  Name: Sherry Henson MRN: [id number] Date of Birth: 22-Dec-1927 Referring Provider: Dr. Victorino Dike   Encounter Date: 03/18/2015      PT End of Session - 03/18/15 1516    Visit Number 2   Number of Visits 8   Date for PT Re-Evaluation 04/11/15   PT Start Time 1516   PT Stop Time 1603   PT Time Calculation (min) 47 min      Past Medical History  Diagnosis Date  . Asthma   . GERD (gastroesophageal reflux disease)   . HLD (hyperlipidemia)   . HTN (hypertension)   . Hypothyroidism   . Hernia     hital  . Long Q-T syndrome   . Diverticulosis   . Arthritis   . PVD (peripheral vascular disease) (HCC)     ceiac artery and SMA stenosis, right renal artery stenosis  . Cerebrovascular disease     Past Surgical History  Procedure Laterality Date  . Bunionectomy    . Torn rotator cuff surgery    . Tonsillectomy    . Appendectomy    . Hysterectomy 9other)    . Laminectomy    . Carpel tunnel surgery      There were no vitals filed for this visit.  Visit Diagnosis:  Stiffness of multiple joints  Pain in joint, ankle and foot, left  Abnormal gait  Weakness generalized      Subjective Assessment - 03/18/15 1522    Subjective "My legs feel different" (since completing HEP- in a good way).     Currently in Pain? Yes   Pain Score 2   up to 9/10 with WB   Pain Location Foot   Pain Descriptors / Indicators Stabbing   Aggravating Factors  walking    Pain Relieving Factors new shoes with cushion   Multiple Pain Sites Yes   Pain Score 5   Pain Location Leg   Pain Orientation Left;Right;Distal  (thighs)   Pain Descriptors / Indicators Dull;Burning            [facility] PT Assessment - 03/18/15 0001    Assessment   Medical Diagnosis Lt foot pain with callous under great toe   Referring Provider Dr. Victorino Dike    Onset Date/Surgical Date 11/12/14   Hand Dominance Right   Next MD Visit after PT   Prior Therapy none   Ambulation/Gait   Pre-Gait Activities TUG: 46.7 without cane, 48 sec with cane (although due to long pause upon rising)   Balance   Balance Assessed Yes         [facility] Adult PT Treatment/Exercise - 03/18/15 0001    Ambulation/Gait   Ambulation/Gait Yes   Ambulation/Gait Assistance --  CGA   Ambulation Distance (Feet) 300 Feet   Assistive device Straight cane   Gait Comments improved gait pattern with use of straight cane.  Decreased step length, decreased clearance bilaterally, slow cadence, decreased arm swing.     Exercises   Exercises Ankle;Knee/Hip   Knee/Hip Exercises: Stretches   Passive Hamstring Stretch Right;Left;3reps;30 seconds   Quad Stretch Limitations tried seated, hamstring cramp; stopped   Gastroc Stretch Right;Left;2 reps;20 seconds  standing   Gastroc Stretch Limitations (also performed 2 x seated with strap)    Knee/Hip Exercises: Standing   Heel Raises Both;5 reps.  Sit to stand x 3 reps  Heel Raises Limitations holding onto RW   Other Standing Knee Exercises standing hip extension with support on RW x 5 sec hold x 5 reps    Knee/Hip Exercises: Seated   Long Arc Quad Right;Left;5 reps   Marching Both;1 set;10 reps   Knee/Hip Exercises: Supine   Quad Sets Strengthening;Left;Right;1 set;10 reps  2 sec hold   Ankle Exercises: Aerobic   Stationary Bike NuStep L2: 5 min                      PT Long Term Goals - 03/14/15 1855    PT LONG TERM GOAL #1   Title I with HEP (04/11/15)    Time 4   Period Weeks   Status New   PT LONG TERM GOAL #2   Title increase bilat ankle dorsiflexion =/> 10 degrees without pain ( 04/11/15)    Time 4   Period Weeks   Status New   PT LONG TERM GOAL #3   Title demo bilat knee and hip extension WNL ( 04/11/15)    Time 4   Period Weeks   Status New   PT LONG TERM GOAL #4    Title increase ankle strength =/> 4+/5 (04/11/15)    Time 4   Period Weeks   Status New   PT LONG TERM GOAL #5   Title increase bilat hip abduction strength =/> 5-/5 (04/11/15)    Time 4   Period Weeks   Status New   PT LONG TERM GOAL #6   Title improve FOTO =/< 56% limited CK level (04/11/15)   Time 4   Period Weeks   Status New               Plan - 03/18/15 1655    Clinical Impression Statement Pt presents with unsteady, antalgic gait.  Pt demonstrated improved gait cadence and confidence with use of SPC during gait trials; reported it helped ease foot pain. Pt scored 47-48 sec on TUG; increased fall risk in community. Pt receptive to using one at home/community.  Pt continues with weakness and decreased ROM in LE.  Will benefit from continued PT intervention to maximize function and safety.    Pt will benefit from skilled therapeutic intervention in order to improve on the following deficits Abnormal gait;Decreased range of motion;Difficulty walking;Decreased activity tolerance;Pain;Decreased strength;Postural dysfunction   Rehab Potential Good   PT Frequency 2x / week   PT Duration 4 weeks   PT Treatment/Interventions Manual techniques;Therapeutic exercise;Moist Heat;Electrical Stimulation;Cryotherapy;Dry needling;Passive range of motion;Patient/family education;Gait training;Neuromuscular re-education;Ultrasound   PT Next Visit Plan LE strengthening, ankle/knee/hip stretching    Consulted and Agree with Plan of Care Patient        Problem List Patient Active Problem List   Diagnosis Date Noted  . Dyspnea 08/31/2012  . SYNCOPE 11/21/2008  . HYPERCHOLESTEROLEMIA, PURE 05/01/2008  . HYPERTENSION, BENIGN 05/01/2008  . CAROTID ARTERY STENOSIS, WITHOUT INFARCTION 05/01/2008  . RENAL ATHEROSCLEROSIS 05/01/2008  . PVD 05/01/2008  . LONG QT SYNDROME 05/01/2008    Mayer CamelJennifer Carlson-Long, PTA 03/18/2015 5:06 PM  [facility]  [place] [address] [place], [place], [address] Phone: [phone]   Fax:  [phone]  Name: Sherry Henson MRN: [id number] Date of Birth: 28-Aug-1927

## 2015-03-20 ENCOUNTER — Ambulatory Visit (INDEPENDENT_AMBULATORY_CARE_PROVIDER_SITE_OTHER): Payer: Medicare Other | Admitting: Physical Therapy

## 2015-03-20 DIAGNOSIS — M25572 Pain in left ankle and joints of left foot: Secondary | ICD-10-CM

## 2015-03-20 DIAGNOSIS — R531 Weakness: Secondary | ICD-10-CM

## 2015-03-20 DIAGNOSIS — M2560 Stiffness of unspecified joint, not elsewhere classified: Secondary | ICD-10-CM | POA: Diagnosis not present

## 2015-03-20 DIAGNOSIS — R269 Unspecified abnormalities of gait and mobility: Secondary | ICD-10-CM

## 2015-03-20 NOTE — Therapy (Signed)
[facility] [place] [address] [place], [place], [address] Phone: [phone]   Fax:  [phone]  Physical Therapy Treatment  Patient Details  Name: Sherry ChuteMary Henson MRN: [id number] Date of Birth: 1927/05/29 Referring Provider: Dr. Victorino DikeHewitt   Encounter Date: 03/20/2015      PT End of Session - 03/20/15 1525    Visit Number 3   Number of Visits 8   Date for PT Re-Evaluation 04/11/15   PT Start Time 1517   PT Stop Time 1603   PT Time Calculation (min) 46 min      Past Medical History  Diagnosis Date  . Asthma   . GERD (gastroesophageal reflux disease)   . HLD (hyperlipidemia)   . HTN (hypertension)   . Hypothyroidism   . Hernia     hital  . Long Q-T syndrome   . Diverticulosis   . Arthritis   . PVD (peripheral vascular disease) (HCC)     ceiac artery and SMA stenosis, right renal artery stenosis  . Cerebrovascular disease     Past Surgical History  Procedure Laterality Date  . Bunionectomy    . Torn rotator cuff surgery    . Tonsillectomy    . Appendectomy    . Hysterectomy 9other)    . Laminectomy    . Carpel tunnel surgery      There were no vitals filed for this visit.  Visit Diagnosis:  Stiffness of multiple joints  Pain in joint, ankle and foot, left  Abnormal gait  Weakness generalized      Subjective Assessment - 03/20/15 1525    Subjective Pt presents with SPC, " I bought it the same day".  Pt reports she feels the same as last session.    Currently in Pain? Yes   Pain Score 2    Pain Location Foot   Pain Orientation Left   Pain Descriptors / Indicators Stabbing   Aggravating Factors  walking    Pain Relieving Factors new shoes with cushion            [facility] PT Assessment - 03/20/15 0001    Assessment   Medical Diagnosis Lt foot pain with callous under great toe   Onset Date/Surgical Date 11/12/14   Hand Dominance Right   Next MD Visit after PT   Prior Therapy none   AROM   AROM  Assessment Site Knee;Ankle   Right/Left Knee Right;Left   Right Knee Extension -8   Left Knee Extension -6   Left Knee Flexion 112   Right/Left Ankle Right;Left   Right Ankle Dorsiflexion 0  PROM, 5 deg   Left Ankle Dorsiflexion 5  PROM, 8 deg          [facility] Adult PT Treatment/Exercise - 03/20/15 0001    Ambulation/Gait   Ambulation/Gait Yes   Ambulation Distance (Feet) 160 Feet   Assistive device Straight cane   Gait Pattern Decreased step length - left;Decreased dorsiflexion - left;Decreased dorsiflexion - right;Decreased weight shift to left;Right flexed knee in stance;Left flexed knee in stance;Antalgic   Knee/Hip Exercises: Stretches   Passive Hamstring Stretch Right;Left;2 reps;30 seconds   Quad Stretch Right;Left;2 reps;30 seconds  seated with foot under seat   Gastroc Stretch Right;Left;2 reps;20 seconds  standing   Gastroc Stretch Limitations (also performed 2 x seated with strap)    Knee/Hip Exercises: Seated   Long Arc Quad Right;Left;1 set;10 reps   Other Seated Knee/Hip Exercises Seated quad sets x 5 sec hold x 5  reps each leg.    Marching Both;20 reps   Ankle Exercises: Aerobic   Stationary Bike NuStep L2: 5 min            PT Long Term Goals - 03/14/15 1855    PT LONG TERM GOAL #1   Title I with HEP (04/11/15)    Time 4   Period Weeks   Status New   PT LONG TERM GOAL #2   Title increase bilat ankle dorsiflexion =/> 10 degrees without pain ( 04/11/15)    Time 4   Period Weeks   Status New   PT LONG TERM GOAL #3   Title demo bilat knee and hip extension WNL ( 04/11/15)    Time 4   Period Weeks   Status New   PT LONG TERM GOAL #4   Title increase ankle strength =/> 4+/5 (04/11/15)    Time 4   Period Weeks   Status New   PT LONG TERM GOAL #5   Title increase bilat hip abduction strength =/> 5-/5 (04/11/15)    Time 4   Period Weeks   Status New   PT LONG TERM GOAL #6   Title improve FOTO =/< 56% limited CK level (04/11/15)   Time 4   Period  Weeks   Status New               Plan - 03/20/15 1700    Clinical Impression Statement Pt demonstrated improved step length with use of cane (with VC).  Pt continues with decreased ROM in bilat ankle DF and knee ext.  Pt requires increased time between exercises due to fatigue and increased time for position changes.  Progressing slowly towards goals.    Pt will benefit from skilled therapeutic intervention in order to improve on the following deficits Abnormal gait;Decreased range of motion;Difficulty walking;Decreased activity tolerance;Pain;Decreased strength;Postural dysfunction   Rehab Potential Good   PT Frequency 2x / week   PT Duration 4 weeks   PT Treatment/Interventions Manual techniques;Therapeutic exercise;Moist Heat;Electrical Stimulation;Cryotherapy;Dry needling;Passive range of motion;Patient/family education;Gait training;Neuromuscular re-education;Ultrasound   PT Next Visit Plan LE strengthening, ankle/knee/hip stretching    Consulted and Agree with Plan of Care Patient        Problem List Patient Active Problem List   Diagnosis Date Noted  . Dyspnea 08/31/2012  . SYNCOPE 11/21/2008  . HYPERCHOLESTEROLEMIA, PURE 05/01/2008  . HYPERTENSION, BENIGN 05/01/2008  . CAROTID ARTERY STENOSIS, WITHOUT INFARCTION 05/01/2008  . RENAL ATHEROSCLEROSIS 05/01/2008  . PVD 05/01/2008  . LONG QT SYNDROME 05/01/2008    Mayer Camel, PTA 03/20/2015 5:03 PM  [facility] [place] [address] [place], [place], [address] Phone: [phone]   Fax:  [phone]  Name: Sherry Henson MRN: [id number] Date of Birth: [birth year]

## 2015-03-26 ENCOUNTER — Ambulatory Visit (INDEPENDENT_AMBULATORY_CARE_PROVIDER_SITE_OTHER): Payer: Medicare Other | Admitting: Physical Therapy

## 2015-03-26 DIAGNOSIS — M25572 Pain in left ankle and joints of left foot: Secondary | ICD-10-CM | POA: Diagnosis not present

## 2015-03-26 DIAGNOSIS — R269 Unspecified abnormalities of gait and mobility: Secondary | ICD-10-CM

## 2015-03-26 DIAGNOSIS — R531 Weakness: Secondary | ICD-10-CM

## 2015-03-26 DIAGNOSIS — M2560 Stiffness of unspecified joint, not elsewhere classified: Secondary | ICD-10-CM | POA: Diagnosis not present

## 2015-03-26 NOTE — Therapy (Signed)
[facility] [place] [address] [place] [address] [place], [place], [address] Phone: [phone]   Fax:  [phone]  Physical Therapy Treatment  Patient Details  Name: Sherry Henson MRN: [id number] Date of Birth: 11-11-1927 Referring Provider: Dr. Victorino Dike   Encounter Date: 03/26/2015      PT End of Session - 03/26/15 1150    Visit Number 4   Number of Visits 8   Date for PT Re-Evaluation 04/11/15   PT Start Time 1150   PT Stop Time 1244   PT Time Calculation (min) 54 min   Activity Tolerance Patient limited by fatigue;Patient limited by pain      Past Medical History  Diagnosis Date  . Asthma   . GERD (gastroesophageal reflux disease)   . HLD (hyperlipidemia)   . HTN (hypertension)   . Hypothyroidism   . Hernia     hital  . Long Q-T syndrome   . Diverticulosis   . Arthritis   . PVD (peripheral vascular disease) (HCC)     ceiac artery and SMA stenosis, right renal artery stenosis  . Cerebrovascular disease     Past Surgical History  Procedure Laterality Date  . Bunionectomy    . Torn rotator cuff surgery    . Tonsillectomy    . Appendectomy    . Hysterectomy 9other)    . Laminectomy    . Carpel tunnel surgery      There were no vitals filed for this visit.  Visit Diagnosis:  Stiffness of multiple joints  Pain in joint, ankle and foot, left  Abnormal gait  Weakness generalized      Subjective Assessment - 03/26/15 1157    Subjective Pt reports she finds it easier to walk with SPC.    Patient Stated Goals pt wishes to be able to walk without limping and have a regular stride, do things with out pain.    Currently in Pain? Yes   Pain Score 7    Pain Location Foot   Pain Orientation Left   Pain Descriptors / Indicators Stabbing   Aggravating Factors  walking    Pain Relieving Factors new shoes with cushion             [facility] PT Assessment - 03/26/15 0001    Assessment   Medical Diagnosis Lt foot pain with  callous under great toe   Referring Provider Dr. Victorino Dike    Onset Date/Surgical Date 11/12/14   Hand Dominance Right   Next MD Visit not scheduled    ROM / Strength   AROM / PROM / Strength Strength;AROM   AROM   AROM Assessment Site Knee;Ankle;Hip   Right/Left Knee Right;Left   Right Knee Extension -7   Right Knee Flexion 121   Left Knee Extension -6   Left Knee Flexion 121   Right Ankle Dorsiflexion 3   Left Ankle Dorsiflexion 7   Strength   Right/Left Ankle Right;Left   Right Ankle Dorsiflexion 5/5   Right Ankle Plantar Flexion --  n/t   Right Ankle Inversion 5/5   Right Ankle Eversion 4/5   Left Ankle Dorsiflexion 5/5   Left Ankle Plantar Flexion --  n/t   Left Ankle Inversion 4/5   Left Ankle Eversion 5/5   Ambulation/Gait   Pre-Gait Activities TUG: 44.96 (with heavy encouragement)                     [facility] Adult PT Treatment/Exercise - 03/26/15 0001    Ambulation/Gait  Ambulation/Gait Yes   Ambulation Distance (Feet) 160 Feet  x 2 reps   Assistive device Straight cane   Gait Pattern Decreased step length - right;Decreased stride length;Trunk flexed;Poor foot clearance - left;Poor foot clearance - right   Gait Comments Hand over hand on SPC to assist with improved timing of placement and  VC for improved step length.    Exercises   Exercises Lumbar   Lumbar Exercises: Prone   Other Prone Lumbar Exercises Prone hip flexor stretch (laying flat) x 2 min    Knee/Hip Exercises: Standing   Heel Raises 5 reps   Hip Flexion Limitations (standing hip/lumbar extension )   Terminal Knee Extension Limitations 5 sec hold, x 8 reps    Knee/Hip Exercises: Seated   Marching Both;20 reps   Knee/Hip Exercises: Supine   Terminal Knee Extension Right;Left;5 reps   Bridges Limitations Unable to tolerate due to LBP   Straight Leg Raises Right;Left;1 set  8 reps   Knee/Hip Exercises: Prone   Hamstring Curl 5 reps   Ankle Exercises: Aerobic   Stationary Bike NuStep  L2: 5 min    Ankle Exercises: Stretches   Gastroc Stretch 2 reps;30 seconds,  Plantar flexor stretch x 20 sec each leg           PT Long Term Goals - 03/26/15 1301    PT LONG TERM GOAL #1   Title I with HEP (04/11/15)    Time 4   Period Weeks   Status On-going   PT LONG TERM GOAL #2   Title increase bilat ankle dorsiflexion =/> 10 degrees without pain ( 04/11/15)    Time 4   Period Weeks   Status On-going   PT LONG TERM GOAL #3   Title demo bilat knee and hip extension WNL ( 04/11/15)    Time 4   Period Weeks   Status On-going   PT LONG TERM GOAL #4   Title increase ankle strength =/> 4+/5 (04/11/15)    Time 4   Period Weeks   Status On-going   PT LONG TERM GOAL #5   Title increase bilat hip abduction strength =/> 5-/5 (04/11/15)    Time 4   Period Weeks   Status On-going   PT LONG TERM GOAL #6   Title improve FOTO =/< 56% limited CK level (04/11/15)   Time 4   Period Weeks   Status On-going               Plan - 03/26/15 1254    Clinical Impression Statement Pt continues to show improvement in gait quality when pt focuses and receives some VC.  Minimal change in knee and ankle ROM.  Pt able to tolerate prone position to stretch hip flexors.  Pt requires increased time for position changes and been exercises due to fatigue. Gradual progress towards established goals.    Pt will benefit from skilled therapeutic intervention in order to improve on the following deficits Abnormal gait;Decreased range of motion;Difficulty walking;Decreased activity tolerance;Pain;Decreased strength;Postural dysfunction   Rehab Potential Good   PT Frequency 2x / week   PT Duration 4 weeks   PT Treatment/Interventions Manual techniques;Therapeutic exercise;Moist Heat;Electrical Stimulation;Cryotherapy;Dry needling;Passive range of motion;Patient/family education;Gait training;Neuromuscular re-education;Ultrasound   PT Next Visit Plan LE strengthening, ankle/knee/hip stretching. Gait  training.    Consulted and Agree with Plan of Care Patient        Problem List Patient Active Problem List   Diagnosis Date Noted  . Dyspnea 08/31/2012  .  SYNCOPE 11/21/2008  . HYPERCHOLESTEROLEMIA, PURE 05/01/2008  . HYPERTENSION, BENIGN 05/01/2008  . CAROTID ARTERY STENOSIS, WITHOUT INFARCTION 05/01/2008  . RENAL ATHEROSCLEROSIS 05/01/2008  . PVD 05/01/2008  . LONG QT SYNDROME 05/01/2008    Mayer CamelJennifer Carlson-Long, PTA 03/26/2015 1:02 PM  [facility] [address] [place] [address] [place], [place], [address] Phone: [phone]   Fax:  [phone]  Name: Fara ChuteMary Murdoch MRN: [id number] Date of Birth: 01-23-1928

## 2015-03-28 ENCOUNTER — Ambulatory Visit (INDEPENDENT_AMBULATORY_CARE_PROVIDER_SITE_OTHER): Payer: Medicare Other | Admitting: Physical Therapy

## 2015-03-28 ENCOUNTER — Encounter: Payer: Self-pay | Admitting: Physical Therapy

## 2015-03-28 DIAGNOSIS — R269 Unspecified abnormalities of gait and mobility: Secondary | ICD-10-CM | POA: Diagnosis not present

## 2015-03-28 DIAGNOSIS — R531 Weakness: Secondary | ICD-10-CM | POA: Diagnosis not present

## 2015-03-28 DIAGNOSIS — M25572 Pain in left ankle and joints of left foot: Secondary | ICD-10-CM

## 2015-03-28 DIAGNOSIS — M2560 Stiffness of unspecified joint, not elsewhere classified: Secondary | ICD-10-CM | POA: Diagnosis not present

## 2015-03-28 NOTE — Therapy (Signed)
[facility] [address] [place] [address] [place], [place], [address] Phone: [phone]   Fax:  [phone]  Physical Therapy Treatment  Patient Details  Name: Sherry Henson MRN: [id number] Date of Birth: 06-09-1927 Referring Provider: Dr. Victorino DikeHewitt   Encounter Date: 03/28/2015      PT End of Session - 03/28/15 1153    Visit Number 5   Number of Visits 8   Date for PT Re-Evaluation 04/11/15   PT Start Time 1153   PT Stop Time 1239   PT Time Calculation (min) 46 min   Activity Tolerance Patient tolerated treatment well  felt a lot of stretching through her hip flexors and quads      Past Medical History  Diagnosis Date  . Asthma   . GERD (gastroesophageal reflux disease)   . HLD (hyperlipidemia)   . HTN (hypertension)   . Hypothyroidism   . Hernia     hital  . Long Q-T syndrome   . Diverticulosis   . Arthritis   . PVD (peripheral vascular disease) (HCC)     ceiac artery and SMA stenosis, right renal artery stenosis  . Cerebrovascular disease     Past Surgical History  Procedure Laterality Date  . Bunionectomy    . Torn rotator cuff surgery    . Tonsillectomy    . Appendectomy    . Hysterectomy 9other)    . Laminectomy    . Carpel tunnel surgery      There were no vitals filed for this visit.  Visit Diagnosis:  Stiffness of multiple joints  Pain in joint, ankle and foot, left  Abnormal gait  Weakness generalized      Subjective Assessment - 03/28/15 1155    Subjective Pt reports she is having bad allergies today. D[place] in [place] and she was excited that her mother came to the airport and she hasn't done that in a long time.    Currently in Pain? No/denies                         [facility]/Exercise - 03/28/15 0001    Exercises   Exercises Lumbar   Knee/Hip Exercises: Aerobic   Nustep L3x 5'   Knee/Hip Exercises: Standing   Hip Extension 20 reps;Both   moving hips away from wall   Knee/Hip Exercises: Sidelying   Clams 20 each side   Manual Therapy   Manual Therapy Passive ROM;Myofascial release;Joint mobilization   Manual therapy comments Rt ITB and quad in s/l   Joint Mobilization PA hip mobs bilat grade II +   Passive ROM s/l hip ext stretching with leg supported                     PT Long Term Goals - 03/26/15 1301    PT LONG TERM GOAL #1   Title I with HEP (04/11/15)    Time 4   Period Weeks   Status On-going   PT LONG TERM GOAL #2   Title increase bilat ankle dorsiflexion =/> 10 degrees without pain ( 04/11/15)    Time 4   Period Weeks   Status On-going   PT LONG TERM GOAL #3   Title demo bilat knee and hip extension WNL ( 04/11/15)    Time 4   Period Weeks   Status On-going   PT LONG TERM GOAL #4   Title increase ankle strength =/> 4+/5 (04/11/15)  Time 4   Period Weeks   Status On-going   PT LONG TERM GOAL #5   Title increase bilat hip abduction strength =/> 5-/5 (04/11/15)    Time 4   Period Weeks   Status On-going   PT LONG TERM GOAL #6   Title improve FOTO =/< 56% limited CK level (04/11/15)   Time 4   Period Weeks   Status On-going               Plan - 03/28/15 1241    Clinical Impression Statement Pt standing up straighter, her family has noticed it.  She continues to be very tight in her LE's, showing slow progress with lengthening    PT Next Visit Plan LE flexibility, passive stretching, joint mobs.    Consulted and Agree with Plan of Care Patient        Problem List Patient Active Problem List   Diagnosis Date Noted  . Dyspnea 08/31/2012  . SYNCOPE 11/21/2008  . HYPERCHOLESTEROLEMIA, PURE 05/01/2008  . HYPERTENSION, BENIGN 05/01/2008  . CAROTID ARTERY STENOSIS, WITHOUT INFARCTION 05/01/2008  . RENAL ATHEROSCLEROSIS 05/01/2008  . PVD 05/01/2008  . LONG QT SYNDROME 05/01/2008    Roderic Scarce PT 03/28/2015, 12:44 PM  [facility] [address] [address] [place], [place], [address] Phone: [phone]   Fax:  [phone]  Name: Sherry Henson MRN: [id number] Date of Birth: 1927-08-31

## 2015-04-03 ENCOUNTER — Encounter: Payer: Self-pay | Admitting: Rehabilitative and Restorative Service Providers"

## 2015-04-03 ENCOUNTER — Ambulatory Visit (INDEPENDENT_AMBULATORY_CARE_PROVIDER_SITE_OTHER): Payer: Medicare Other | Admitting: Rehabilitative and Restorative Service Providers"

## 2015-04-03 DIAGNOSIS — R269 Unspecified abnormalities of gait and mobility: Secondary | ICD-10-CM

## 2015-04-03 DIAGNOSIS — M25572 Pain in left ankle and joints of left foot: Secondary | ICD-10-CM | POA: Diagnosis not present

## 2015-04-03 DIAGNOSIS — R531 Weakness: Secondary | ICD-10-CM

## 2015-04-03 DIAGNOSIS — M2560 Stiffness of unspecified joint, not elsewhere classified: Secondary | ICD-10-CM | POA: Diagnosis not present

## 2015-04-03 NOTE — Therapy (Signed)
[facility] [place] [address] [place], [place], [address] Phone: [phone]   Fax:  [phone]  Physical Therapy Treatment  Patient Details  Name: Sherry Henson MRN: [id number] Date of Birth: 1927-12-16 Referring Provider: Dr. Victorino Dike   Encounter Date: 04/03/2015      PT End of Session - 04/03/15 1029    Visit Number 6   Number of Visits 8   Date for PT Re-Evaluation 04/11/15   PT Start Time 1021   PT Stop Time 1106   PT Time Calculation (min) 45 min   Activity Tolerance Patient tolerated treatment well      Past Medical History  Diagnosis Date  . Asthma   . GERD (gastroesophageal reflux disease)   . HLD (hyperlipidemia)   . HTN (hypertension)   . Hypothyroidism   . Hernia     hital  . Long Q-T syndrome   . Diverticulosis   . Arthritis   . PVD (peripheral vascular disease) (HCC)     ceiac artery and SMA stenosis, right renal artery stenosis  . Cerebrovascular disease     Past Surgical History  Procedure Laterality Date  . Bunionectomy    . Torn rotator cuff surgery    . Tonsillectomy    . Appendectomy    . Hysterectomy 9other)    . Laminectomy    . Carpel tunnel surgery      There were no vitals filed for this visit.  Visit Diagnosis:  Stiffness of multiple joints  Pain in joint, ankle and foot, left  Abnormal gait  Weakness generalized      Subjective Assessment - 04/03/15 1222    Subjective No soreness or increased discomfort following last visit. Can tell she is standing straighter with exercises/activities and walking today in PT - will try to work on this at home.    Currently in Pain? No/denies                         [facility] Adult PT Treatment/Exercise - 04/03/15 0001    Ambulation/Gait   Ambulation Distance (Feet) 160 Feet  x2   Assistive device --  none   Gait Pattern --  working on stride length and posture    Exercises   Exercises Lumbar   Lumbar Exercises:  Stretches   Passive Hamstring Stretch Right;Left;2 reps;30 seconds   Passive Hamstring Stretch Limitations assisted encouraging knee extension    Knee/Hip Exercises: Stretches   Other Knee/Hip Stretches hip adductor stretch with LE extended at hip and knee resting on treatment table 30 sec hold x 2 reps    Knee/Hip Exercises: Aerobic   Nustep L3x 5'   Knee/Hip Exercises: Standing   Hip Extension 20 reps;Both  moving hips away from wall 5 sec hold   Extension Limitations active hip ext facing wall x 10 each 2 sets    Other Standing Knee Exercises working on standing posture and alignment; equal weight bearing bilat LE's    Knee/Hip Exercises: Sidelying   Clams 20 each side   Manual Therapy   Manual Therapy Passive ROM;Myofascial release;Joint mobilization   Manual therapy comments Rt/Lt ITB and quad in s/l   Joint Mobilization PA hip mobs bilat grade II +/sidelying   ankle mobs Grade II post tib on calcaneous bilat 4-5x    Passive ROM s/l hip ext stretching with leg supported; prolonged stretch to tolerance   passive ankle DF and circles CW/CCW - shoes still on  PT Long Term Goals - 03/26/15 1301    PT LONG TERM GOAL #1   Title I with HEP (04/11/15)    Time 4   Period Weeks   Status On-going   PT LONG TERM GOAL #2   Title increase bilat ankle dorsiflexion =/> 10 degrees without pain ( 04/11/15)    Time 4   Period Weeks   Status On-going   PT LONG TERM GOAL #3   Title demo bilat knee and hip extension WNL ( 04/11/15)    Time 4   Period Weeks   Status On-going   PT LONG TERM GOAL #4   Title increase ankle strength =/> 4+/5 (04/11/15)    Time 4   Period Weeks   Status On-going   PT LONG TERM GOAL #5   Title increase bilat hip abduction strength =/> 5-/5 (04/11/15)    Time 4   Period Weeks   Status On-going   PT LONG TERM GOAL #6   Title improve FOTO =/< 56% limited CK level (04/11/15)   Time 4   Period Weeks   Status On-going                Plan - 04/03/15 1229    Clinical Impression Statement Continued hip and ankle tightness. Tolerated mobilization and PROM/stretching with minimal discomfort. Stood and walked with improved posture and alignment following exercise and manual work. Remains significantly shortending through hips/LE's    Pt will benefit from skilled therapeutic intervention in order to improve on the following deficits Abnormal gait;Decreased range of motion;Difficulty walking;Decreased activity tolerance;Pain;Decreased strength;Postural dysfunction   Rehab Potential Good   PT Frequency 2x / week   PT Duration 4 weeks   PT Treatment/Interventions Manual techniques;Therapeutic exercise;Moist Heat;Electrical Stimulation;Cryotherapy;Dry needling;Passive range of motion;Patient/family education;Gait training;Neuromuscular re-education;Ultrasound   PT Next Visit Plan LE flexibility, passive stretching, joint mobs.    Consulted and Agree with Plan of Care Patient        Problem List Patient Active Problem List   Diagnosis Date Noted  . Dyspnea 08/31/2012  . SYNCOPE 11/21/2008  . HYPERCHOLESTEROLEMIA, PURE 05/01/2008  . HYPERTENSION, BENIGN 05/01/2008  . CAROTID ARTERY STENOSIS, WITHOUT INFARCTION 05/01/2008  . RENAL ATHEROSCLEROSIS 05/01/2008  . PVD 05/01/2008  . LONG QT SYNDROME 05/01/2008    Kaelum Kissick Rober MinionP Kanisha Duba PT, MPH  04/03/2015, 12:33 PM  [facility] [address] [place], [place], [address] Phone: [phone]   Fax:  [phone]  Name: Sherry ChuteMary Henson MRN: [id number] Date of Birth: [birth year]

## 2015-04-05 ENCOUNTER — Ambulatory Visit (INDEPENDENT_AMBULATORY_CARE_PROVIDER_SITE_OTHER): Payer: Medicare Other | Admitting: Physical Therapy

## 2015-04-05 ENCOUNTER — Encounter: Payer: Self-pay | Admitting: Physical Therapy

## 2015-04-05 DIAGNOSIS — M25572 Pain in left ankle and joints of left foot: Secondary | ICD-10-CM

## 2015-04-05 DIAGNOSIS — R531 Weakness: Secondary | ICD-10-CM | POA: Diagnosis not present

## 2015-04-05 DIAGNOSIS — M2560 Stiffness of unspecified joint, not elsewhere classified: Secondary | ICD-10-CM

## 2015-04-05 NOTE — Therapy (Signed)
[place], [place], [address] Phone: [phone]   Fax:  [phone]  Physical Therapy Treatment  Patient Details  Name: Sherry Henson MRN: [id number] Date of Birth: [birth year] Referring Provider: Dr. Doran Durand   Encounter Date: 04/05/2015      PT End of Session - 04/05/15 1152    Visit Number 7   Number of Visits 8   Date for PT Re-Evaluation 04/11/15   PT Start Time 1150   PT Stop Time 1240   PT Time Calculation (min) 50 min   Activity Tolerance Patient tolerated treatment well      Past Medical History  Diagnosis Date  . Asthma   . GERD (gastroesophageal reflux disease)   . HLD (hyperlipidemia)   . HTN (hypertension)   . Hypothyroidism   . Hernia     hital  . Long Q-T syndrome   . Diverticulosis   . Arthritis   . PVD (peripheral vascular disease) ([facility])     ceiac artery and SMA stenosis, right renal artery stenosis  . Cerebrovascular disease     Past Surgical History  Procedure Laterality Date  . Bunionectomy    . Torn rotator cuff surgery    . Tonsillectomy    . Appendectomy    . Hysterectomy 9other)    . Laminectomy    . Carpel tunnel surgery      There were no vitals filed for this visit.  Visit Diagnosis:  Stiffness of multiple joints  Pain in joint, ankle and foot, left  Weakness generalized      Subjective Assessment - 04/05/15 1153    Subjective Pt reports she is feeling good, no pain   Currently in Pain? No/denies                         [facility] Adult PT Treatment/Exercise - 04/05/15 0001    Lumbar Exercises: Standing   Other Standing Lumbar Exercises with back against noodle, scap retraction arms at sides and abducted with red band x 10  pt dizzy afterwards and had to sit and rest.    Lumbar Exercises: Supine   Bridge --  attempted had Lt HS spasm   Bridge Limitations perform with legs straight and on bolster   Other Supine Lumbar  Exercises 10x5sec leg presses bilat.    Knee/Hip Exercises: Aerobic   Nustep L4x6'   Knee/Hip Exercises: Standing   Hip Extension 20 reps;Both   Extension Limitations active hip ext facing wall x 10 each 2 sets    Modalities   Modalities Moist Heat   Moist Heat Therapy   Number Minutes Moist Heat 12 Minutes   Moist Heat Location --  bilat HS and bilat hip flexors   Manual Therapy   Manual Therapy Passive ROM;Myofascial release;Joint mobilization   Manual therapy comments Rt/Lt ITB and quad in s/l   Joint Mobilization PA hip mobs bilat grade II +/sidelying    Passive ROM s/l hip ext stretching with leg supported; prolonged stretch to tolerance    Ankle Exercises: Supine   T-Band DF with red band x20 Lt, yellow band on Rt                      PT Long Term Goals - 03/26/15 1301    PT LONG TERM GOAL #1   Title I with HEP (04/11/15)    Time 4   Period Weeks   Status On-going  PT LONG TERM GOAL #2   Title increase bilat ankle dorsiflexion =/> 10 degrees without pain ( 04/11/15)    Time 4   Period Weeks   Status On-going   PT LONG TERM GOAL #3   Title demo bilat knee and hip extension WNL ( 04/11/15)    Time 4   Period Weeks   Status On-going   PT LONG TERM GOAL #4   Title increase ankle strength =/> 4+/5 (04/11/15)    Time 4   Period Weeks   Status On-going   PT LONG TERM GOAL #5   Title increase bilat hip abduction strength =/> 5-/5 (04/11/15)    Time 4   Period Weeks   Status On-going   PT LONG TERM GOAL #6   Title improve FOTO =/< 56% limited CK level (04/11/15)   Time 4   Period Weeks   Status On-going               Plan - 04/05/15 1230    Clinical Impression Statement Slowly improving hip flexibility.  limited today due to hamstring spasms,  Heat helped these relax. No new goal met.     Pt will benefit from skilled therapeutic intervention in order to improve on the following deficits Abnormal gait;Decreased range of motion;Difficulty  walking;Decreased activity tolerance;Pain;Decreased strength;Postural dysfunction   Rehab Potential Good   PT Frequency 2x / week   PT Duration 4 weeks   PT Treatment/Interventions Manual techniques;Therapeutic exercise;Moist Heat;Electrical Stimulation;Cryotherapy;Dry needling;Passive range of motion;Patient/family education;Gait training;Neuromuscular re-education;Ultrasound   PT Next Visit Plan Le flexibility, Extension based strengthening, reassess for cont PT   Consulted and Agree with Plan of Care Patient        Problem List Patient Active Problem List   Diagnosis Date Noted  . Dyspnea 08/31/2012  . SYNCOPE 11/21/2008  . HYPERCHOLESTEROLEMIA, PURE 05/01/2008  . HYPERTENSION, BENIGN 05/01/2008  . CAROTID ARTERY STENOSIS, WITHOUT INFARCTION 05/01/2008  . RENAL ATHEROSCLEROSIS 05/01/2008  . PVD 05/01/2008  . LONG QT SYNDROME 05/01/2008    Jeral Pinch PT 04/05/2015, 12:32 PM  [facility] [place], [place], [address] Phone: [phone]   Fax:  [phone]  Name: Sherry Henson MRN: [id number] Date of Birth: Dec 22, 1927

## 2015-04-10 ENCOUNTER — Ambulatory Visit (INDEPENDENT_AMBULATORY_CARE_PROVIDER_SITE_OTHER): Payer: Medicare Other | Admitting: Physical Therapy

## 2015-04-10 DIAGNOSIS — R531 Weakness: Secondary | ICD-10-CM | POA: Diagnosis not present

## 2015-04-10 DIAGNOSIS — M25572 Pain in left ankle and joints of left foot: Secondary | ICD-10-CM | POA: Diagnosis not present

## 2015-04-10 DIAGNOSIS — R269 Unspecified abnormalities of gait and mobility: Secondary | ICD-10-CM

## 2015-04-10 DIAGNOSIS — M2560 Stiffness of unspecified joint, not elsewhere classified: Secondary | ICD-10-CM | POA: Diagnosis not present

## 2015-04-10 NOTE — Therapy (Addendum)
[place], [place], [address] Phone: [phone]   Fax:  [phone]  Physical Therapy Treatment  Patient Details  Name: Sherry Henson MRN: [id number] Date of Birth: 1927/07/13 Referring Provider: Dr. Doran Durand  Encounter Date: 04/10/2015      PT End of Session - 04/10/15 1152    Visit Number 8   Number of Visits 8   Date for PT Re-Evaluation 04/11/15   PT Start Time 1937   PT Stop Time 9024   PT Time Calculation (min) 53 min   Activity Tolerance Patient tolerated treatment well      Past Medical History  Diagnosis Date  . Asthma   . GERD (gastroesophageal reflux disease)   . HLD (hyperlipidemia)   . HTN (hypertension)   . Hypothyroidism   . Hernia     hital  . Long Q-T syndrome   . Diverticulosis   . Arthritis   . PVD (peripheral vascular disease) ([facility])     ceiac artery and SMA stenosis, right renal artery stenosis  . Cerebrovascular disease     Past Surgical History  Procedure Laterality Date  . Bunionectomy    . Torn rotator cuff surgery    . Tonsillectomy    . Appendectomy    . Hysterectomy 9other)    . Laminectomy    . Carpel tunnel surgery      There were no vitals filed for this visit.  Visit Diagnosis:  Stiffness of multiple joints  Pain in joint, ankle and foot, left  Weakness generalized  Abnormal gait      Subjective Assessment - 04/10/15 1153    Subjective Pt reports her Lt foot began hurting last night, otherwise foot had been feeling better.  "Foot feels like it's been scalded"  Has been walking a lot more due to holidays and company. Pt reports she is interested in doing exercises at home, and d/c due to financial concerns.    Pertinent History bilat RTC surgery 5 yrs ago, bunionectomy Lt 1968, injections in bilat knees in August 2016    Patient Stated Goals pt wishes to be able to walk without limping and have a regular stride, do things with out pain.     Currently in Pain? Yes   Pain Score 5    Pain Location Foot   Pain Orientation Left   Pain Descriptors / Indicators Burning   Aggravating Factors  walking    Pain Relieving Factors tape, ointment, rest             [facility] PT Assessment - 04/10/15 0001    Assessment   Medical Diagnosis Lt foot pain with callous under great toe   Referring Provider Dr. Doran Durand   Onset Date/Surgical Date 11/12/14   Hand Dominance Right   Next MD Visit not scheduled    Observation/Other Assessments   Focus on Therapeutic Outcomes (FOTO)  2% limited   AROM   Right/Left Knee Right;Left   Right Knee Extension -6  with quad set, supine   Right Knee Flexion 121   Left Knee Extension -6   Left Knee Flexion 121   Right/Left Ankle Right;Left   Right Ankle Dorsiflexion 5   Left Ankle Dorsiflexion 7   Strength   Right Hip ABduction --  5-/5 in sitting   Left Hip ADduction --  5-/5 in sitting   Right/Left Ankle Right;Left   Right Ankle Dorsiflexion 5/5   Right Ankle Plantar Flexion --  5 with break  test, unable to SL heel raise    Right Ankle Inversion 5/5   Right Ankle Eversion 4+/5   Left Ankle Dorsiflexion 5/5   Left Ankle Plantar Flexion --  5 with break test, unable to SL heel raise    Left Ankle Inversion 5/5   Left Ankle Eversion 5/5                     [facility] Adult PT Treatment/Exercise - 04/10/15 0001    Lumbar Exercises: Supine   Bridge --  attempted had Lt HS spasm   Other Supine Lumbar Exercises bilat shoulder horiz abd with yellow band x 10 reps, bilat shoulder flex with yellow band x 10 reps, bilat shoulder ER x 10 reps   Knee/Hip Exercises: Stretches   Passive Hamstring Stretch Right;Left;2 reps;20 seconds   Quad Stretch Right;Left;2 reps;30 seconds  seated with foot under seat   Quad Stretch Limitations assisted by PTA to prevent hamstring cramp   Gastroc Stretch Right;Left;2 reps;20 seconds  standing   Knee/Hip Exercises: Aerobic   Nustep L4x6'   Knee/Hip  Exercises: Standing   Heel Raises Both;1 set;5 reps   Heel Raises Limitations UE support    Hip Extension Both;10 reps  moving hips away from wall 5 sec hold   Functional Squat 5 reps   Knee/Hip Exercises: Seated   Marching Both;20 reps                PT Education - 04/10/15 1311    Education provided Yes   Education Details HEP - added UE exercises to improve posture with gait, and hip ext, heel raises, calf stretch   Person(s) Educated Patient;Spouse   Methods Explanation;Verbal cues;Tactile cues;Demonstration;Handout   Comprehension Verbalized understanding;Returned demonstration;Verbal cues required             PT Long Term Goals - 04/10/15 1215    PT LONG TERM GOAL #1   Title I with HEP (04/11/15)    Time 4   Period Weeks   Status Achieved   PT LONG TERM GOAL #2   Title increase bilat ankle dorsiflexion =/> 10 degrees without pain ( 04/11/15)    Time 4   Period Weeks   Status Not Met   PT LONG TERM GOAL #3   Title demo bilat knee and hip extension WNL ( 04/11/15)    Time 4   Period Weeks   Status Not Met   PT LONG TERM GOAL #4   Title increase ankle strength =/> 4+/5 (04/11/15)    Time 4   Period Weeks   Status Achieved   PT LONG TERM GOAL #5   Title increase bilat hip abduction strength =/> 5-/5 (04/11/15)    Time 4   Period Weeks   Status Achieved   PT LONG TERM GOAL #6   Title improve FOTO =/< 56% limited CK level (04/11/15)   Time 4   Period Weeks   Status Achieved               Plan - 04/10/15 1316    Clinical Impression Statement Pt has demonstrated some gains in ankle flexibility and strength since beginning therapy. Pt's gait quality improved since initial visit, more fluid with increased step length.   Pt has partially met her goals, but is requesting to d/c to HEP at this time due to financial concerns.     Pt will benefit from skilled therapeutic intervention in order to improve on the following deficits Abnormal  gait;Decreased range of motion;Difficulty walking;Decreased activity tolerance;Pain;Decreased strength;Postural dysfunction   Rehab Potential Good   PT Frequency 2x / week   PT Duration 4 weeks   PT Treatment/Interventions Manual techniques;Therapeutic exercise;Moist Heat;Electrical Stimulation;Cryotherapy;Dry needling;Passive range of motion;Patient/family education;Gait training;Neuromuscular re-education;Ultrasound   PT Next Visit Plan Spoke to supervising PT; will d/c at this time to HEP per pt's request.    Consulted and Agree with Plan of Care Patient       PT G-Codes: Functional Assessment Tool: FOTO 2% limited Functional Limitation: Mobility Goal Status: CK Discharge Status: CI   Problem List Patient Active Problem List   Diagnosis Date Noted  . Dyspnea 08/31/2012  . SYNCOPE 11/21/2008  . HYPERCHOLESTEROLEMIA, PURE 05/01/2008  . HYPERTENSION, BENIGN 05/01/2008  . CAROTID ARTERY STENOSIS, WITHOUT INFARCTION 05/01/2008  . RENAL ATHEROSCLEROSIS 05/01/2008  . PVD 05/01/2008  . LONG QT SYNDROME 05/01/2008    Kerin Perna, PTA 04/10/2015 2:32 PM   Laureen Abrahams, PT, DPT 04/10/2015 2:35 PM   [facility] [place], [place], [address] Phone: [phone]   Fax:  [phone]  Name: Sherry Henson MRN: [id number] Date of Birth: July 12, 1927   PHYSICAL THERAPY DISCHARGE SUMMARY  Visits from Start of Care: 8  Current functional level related to goals / functional outcomes: Doing very well functionally   Remaining deficits: Dorsiflexion is not WNL   Education / Equipment: HEP Plan: Patient agrees to discharge.  Patient goals were partially met. Patient is being discharged due to being pleased with the current functional level.  ?????    Jeral Pinch, PT 04/16/2015 10:02 AM

## 2015-04-10 NOTE — Patient Instructions (Addendum)
Trunk Extension    Standing, place back of open hands on low back, or counter. Straighten spine then arch the back, shift hips forward, and move shoulders back. Repeat __10_ times, 5 sec hold..  Over Head Pull: Narrow Grip     [place] [phone]   On back, knees bent, feet flat, band across thighs, elbows straight but relaxed. Pull hands apart (start). Keeping elbows straight, bring arms up and over head, hands toward floor. Keep pull steady on band. Hold momentarily. Return slowly, keeping pull steady, back to start. Repeat _10__ times. Band color __yellow ____   Side Pull: Double Arm   On back, knees bent, feet flat. Arms perpendicular to body, shoulder level, elbows straight but relaxed. Pull arms out to sides, elbows straight. Resistance band comes across collarbones, hands toward floor. Hold momentarily. Slowly return to starting position. Repeat _10__ times. Band color _yellow____   Shoulder Rotation: Double Arm   On back, knees bent, feet flat, elbows tucked at sides, bent 90, hands palms up. Pull hands apart and down toward floor, keeping elbows near sides. Hold momentarily. Slowly return to starting position. Repeat _10__ times. Band color _yellow _____   Heel Raises    Stand with support. Tighten pelvic floor and hold. With knees straight, raise heels off ground.  Repeat _5-10__ times. Do _1__ times a day.  Achilles Tendon Stretch    Stand with hands supported on wall, elbows slightly bent, feet parallel and both heels on floor, front knee bent, back knee straight. Slowly relax back knee until a stretch is felt in achilles tendon. Hold _15___ seconds. Repeat with leg positions switched.  [facility] [address] [place], [place] [address]  [phone] (office) [phone] (fax)

## 2015-04-16 ENCOUNTER — Encounter: Payer: Medicare Other | Admitting: Physical Therapy

## 2015-04-18 ENCOUNTER — Encounter: Payer: Medicare Other | Admitting: Physical Therapy

## 2015-08-28 ENCOUNTER — Other Ambulatory Visit: Payer: Self-pay | Admitting: Cardiology

## 2015-08-29 NOTE — Telephone Encounter (Signed)
Rx request sent to pharmacy.  

## 2015-09-27 ENCOUNTER — Other Ambulatory Visit: Payer: Self-pay | Admitting: Cardiology

## 2015-09-30 NOTE — Telephone Encounter (Signed)
Rx(s) sent to pharmacy electronically.  

## 2015-10-26 ENCOUNTER — Other Ambulatory Visit: Payer: Self-pay | Admitting: Cardiology

## 2015-10-28 NOTE — Telephone Encounter (Signed)
Rx(s) sent to pharmacy electronically.  

## 2015-11-27 ENCOUNTER — Other Ambulatory Visit: Payer: Self-pay | Admitting: Cardiology

## 2015-12-02 ENCOUNTER — Other Ambulatory Visit: Payer: Self-pay | Admitting: *Deleted

## 2015-12-02 MED ORDER — ATENOLOL 25 MG PO TABS: 25.0000 mg | ORAL_TABLET | Freq: Every day | ORAL | 0 refills | Status: AC

## 2016-01-07 ENCOUNTER — Other Ambulatory Visit: Payer: Self-pay | Admitting: Cardiology

## 2016-03-13 ENCOUNTER — Telehealth: Payer: Self-pay | Admitting: Cardiology

## 2016-03-13 NOTE — Telephone Encounter (Signed)
I returned call to pt to make her aware to keep appt with Dr. Jens Somrenshaw on 12/4.  Pt's daughter answered phone and asked me to speak with home health nurse Rosey Bath(Teresa).  I spoke with Rosey Batheresa who reports pt's husband was recently hospitalized and her agency has been contacted to help family with medication management.  I explained to Rosey Batheresa our office had not seen pt since 5/15 and our med list may not be up to date.  Rosey Batheresa reports pt has allergy to metoprolol listed and she is asking if OK to continue atenolol.  Rosey Batheresa is not sure if pt has been taking atenolol but pt does have 10 pills left.  I told her if pt has been taking without problems she could continue but if not taking she should not resume prior to appt with Dr. Jens Somrenshaw on 12/4.  Rosey Batheresa states pt has [facility] and she is asking if pt should keep appt with Dr Jens Somrenshaw on 12/4.  I told her if she wants further refills from our office she would need to keep this appt.   Rosey Batheresa reports they would like to cancel appt for 12/4 and will [facility].

## 2016-03-13 NOTE — Telephone Encounter (Signed)
New Message   *STAT* If patient is at the pharmacy, call can be transferred to refill team.   1. Which medications need to be refilled? (please list name of each medication and dose if known)  Atenilol 25 mg tablet once daily  2. Which pharmacy/location (including street and city if local pharmacy) is medication to be sent to? [id number] [place], [place]  3. Do they need a 30 day or 90 day supply?  30 day supply

## 2016-03-16 ENCOUNTER — Ambulatory Visit: Payer: Medicare Other | Admitting: Cardiology

## 2016-11-11 ENCOUNTER — Ambulatory Visit (INDEPENDENT_AMBULATORY_CARE_PROVIDER_SITE_OTHER): Payer: Medicare Other | Admitting: Rehabilitative and Restorative Service Providers"

## 2016-11-11 DIAGNOSIS — M25561 Pain in right knee: Secondary | ICD-10-CM

## 2016-11-11 DIAGNOSIS — R269 Unspecified abnormalities of gait and mobility: Secondary | ICD-10-CM | POA: Diagnosis not present

## 2016-11-11 DIAGNOSIS — R531 Weakness: Secondary | ICD-10-CM | POA: Diagnosis not present

## 2016-11-11 DIAGNOSIS — R293 Abnormal posture: Secondary | ICD-10-CM | POA: Diagnosis not present

## 2016-11-11 NOTE — Therapy (Signed)
[facility] [address] [place], [place], [address] Phone: [phone]   Fax:  [phone]  Physical Therapy Evaluation  Patient Details  Name: Sherry ChuteMary Henson MRN: [id number] Date of Birth: 1927/07/04 Referring Provider: Dr Rosie Fateebecca Henderson  Encounter Date: 11/11/2016      PT End of Session - 11/11/16 1407    Visit Number 1   Number of Visits 12   Date for PT Re-Evaluation 12/23/16   PT Start Time 1407   PT Stop Time 1505   PT Time Calculation (min) 58 min   Activity Tolerance Patient tolerated treatment well      Past Medical History:  Diagnosis Date  . Arthritis   . Asthma   . Cerebrovascular disease   . Diverticulosis   . GERD (gastroesophageal reflux disease)   . Hernia    hital  . HLD (hyperlipidemia)   . HTN (hypertension)   . Hypothyroidism   . Long Q-T syndrome   . PVD (peripheral vascular disease) (HCC)    ceiac artery and SMA stenosis, right renal artery stenosis    Past Surgical History:  Procedure Laterality Date  . APPENDECTOMY    . BUNIONECTOMY    . carpel tunnel surgery    . hysterectomy 9other)    . LAMINECTOMY    . TONSILLECTOMY    . torn rotator cuff surgery      There were no vitals filed for this visit.       Subjective Assessment - 11/11/16 1419    Subjective Patient reports that she has been having Rt knee pain for the past few months after sliding off her bed hitting feet on wall and forcefully hitting wal with foot; jamming knee. She has had Rt knee pain since that time. Pain has not changed significantly since injury. She has difficulty standing after having been sitting for any period of time. Patient is unable to negotiate stiars or squat down    Pertinent History arthritis; ambulates with walker; cardiac arrest ~ ? months with some neurological deficits (patient is a poor historian - unable to provide accurate dates or medical history) reports that she does have a  defibulator    Diagnostic tests xrays    Patient Stated Goals get rid of some of the knee pain    Currently in Pain? Yes   Pain Location Knee   Pain Orientation Right   Pain Descriptors / Indicators Aching   Pain Type Acute pain   Pain Onset More than a month ago   Aggravating Factors  getting up after having been sitting for 5 min    Pain Relieving Factors heat; injection            [facility] PT Assessment - 11/11/16 0001      Assessment   Medical Diagnosis Rt knee pain    Referring Provider Dr Rosie Fateebecca Henderson   Onset Date/Surgical Date 07/26/16   Hand Dominance Right   Next MD Visit 8/18   Prior Therapy yes - 2016     Precautions   Precaution Comments dementia     Restrictions   Weight Bearing Restrictions No     Balance Screen   Has the patient fallen in the past 6 months Yes   How many times? 2   Has the patient had a decrease in activity level because of a fear of falling?  No   Is the patient reluctant to leave their home because of a fear of falling?  No  Prior Function   Level of Independence Independent   Vocation Retired   Leisure sedentary lifestyle      Observation/Other Assessments   Focus on Therapeutic Outcomes (FOTO)  61% limitation      Posture/Postural Control   Posture Comments head forward; flexed forward at hips; knees flexed; forward posture      AROM   Right/Left Hip --  end range tightness bilat    Right Hip Extension -15   Left Hip Extension -10   Right Knee Extension -15   Right Knee Flexion 116   Left Knee Extension -12   Left Knee Flexion 125     Strength   Right/Left Hip --  grossly 4+/5 except hip ext 4/5 unable to assess in prone    Right/Left Knee --  4+/5      Flexibility   Hamstrings tight Rt 68 deg; Lt 74 deg    Quadriceps nable to assess in prone - tight bilat in sidelying    ITB tight bilat      Palpation   Palpation comment tender to palpation medial and lateral Rt knee - tight musculature noted through  hamstrings and quads Rt      Ambulation/Gait   Gait Comments ambulates with rolling walker forward flexed at trunk and hips; knees flexed      Balance   Balance Assessed --  can stand <5 sec without UE support      Standardized Balance Assessment   Standardized Balance Assessment Berg Balance Test     Berg Balance Test   Sit to Stand Able to stand  independently using hands   Standing Unsupported Able to stand safely 2 minutes   Sitting with Back Unsupported but Feet Supported on Floor or Stool Able to sit safely and securely 2 minutes   Stand to Sit Controls descent by using hands   Transfers Able to transfer safely, definite need of hands   Standing Unsupported with Eyes Closed Able to stand 10 seconds with supervision   Standing Ubsupported with Feet Together Able to place feet together independently and stand 1 minute safely   From Standing, Reach Forward with Outstretched Arm Can reach confidently >25 cm (10")   From Standing Position, Pick up Object from Floor Able to pick up shoe safely and easily   From Standing Position, Turn to Look Behind Over each Shoulder Needs assist to keep from losing balance and falling   Turn 360 Degrees Able to turn 360 degrees safely but slowly   Standing Unsupported, Alternately Place Feet on Step/Stool Needs assistance to keep from falling or unable to try   Standing Unsupported, One Foot in [employer] balance while stepping or standing   Standing on One Leg Tries to lift leg/unable to hold 3 seconds but remains standing independently   Total Score 35   Berg comment: high risk for falls             Objective measurements completed on examination: See above findings.          [facility] Adult PT Treatment/Exercise - 11/11/16 0001      Knee/Hip Exercises: Stretches   Passive Hamstring Stretch Right;Left;3 reps;30 seconds     Knee/Hip Exercises: Standing   Other Standing Knee Exercises working on standing posture and alignment -  straightening knees                 PT Education - 11/11/16 1456    Education provided Yes   Education Details HEP  Person(s) Educated Patient   Methods Explanation;Demonstration;Tactile cues;Verbal cues;Handout   Comprehension Verbalized understanding;Returned demonstration;Verbal cues required;Tactile cues required             PT Long Term Goals - Dec 07, 2016 1510      PT LONG TERM GOAL #1   Title Improve upright posture and alignment with patient to demonstrate increased trunk; hip and knee extension 12/23/16   Time 6   Period Weeks   Status New     PT LONG TERM GOAL #2   Title Increase strength bilat LE's to 5-/5 to 5/5 12/23/16   Time 6   Period Weeks   Status New     PT LONG TERM GOAL #3   Title Improve gait pattern with increased safety - improve Berg balance score by 3-7 points thus decreasing risk for falling 12/23/16   Time 6   Period Weeks   Status New     PT LONG TERM GOAL #4   Title Independent in HEP 12/23/16   Time 6   Period Weeks   Status New     PT LONG TERM GOAL #5   Title Improve FOTO to </= 52% limitation 12/23/16   Time 6   Period Weeks   Status New                Plan - 2016-12-07 1504    Clinical Impression Statement Xariah presents with c/o Rt knee pain for several months after she slid off the bed jamming her Rt knee with continued knee pain since that time. Keishawna has poor posture and alignment; limited trunk and LE ROM/mobility; decreased LE strength; decreased balance; dependent gait; decreased balance; high risk of falling. She will benefit from PT to address problems identified.     History and Personal Factors relevant to plan of care: long standing walker use; dementia; knee pain    Clinical Presentation Evolving   Clinical Decision Making Low   Rehab Potential Good   PT Frequency 2x / week   PT Duration 6 weeks   PT Treatment/Interventions Patient/family education;ADLs/Self Care Home Management;Cryotherapy;Electrical  Stimulation;Iontophoresis 4mg /ml Dexamethasone;Moist Heat;Ultrasound;Dry needling;Manual techniques;Therapeutic activities;Therapeutic exercise;Neuromuscular re-education;Gait training;Balance training   PT Next Visit Plan progress with stretching; stretch hip flexors; standing balance and neuromuscular re-ed; strengthening; gait training; modalities for Rt knee pain    Consulted and Agree with Plan of Care Patient;Family member/caregiver   Family Member Consulted husband      Patient will benefit from skilled therapeutic intervention in order to improve the following deficits and impairments:  Postural dysfunction, Improper body mechanics, Pain, Decreased range of motion, Decreased mobility, Decreased strength, Abnormal gait, Increased fascial restricitons, Decreased activity tolerance  Visit Diagnosis: Acute pain of right knee - Plan: PT plan of care cert/re-cert  Weakness generalized - Plan: PT plan of care cert/re-cert  Abnormal posture - Plan: PT plan of care cert/re-cert  Abnormal gait - Plan: PT plan of care cert/re-cert      [facility] PT PB G-CODES - December 07, 2016 1514    Functional Assessment Tool Used  Evaluation; FOTO; clinical assessment    Functional Limitations Mobility: Walking and moving around   Mobility: Walking and Moving Around Current Status At least 60 percent but less than 80 percent impaired, limited or restricted   Mobility: Walking and Moving Around Goal Status [phone]) At least 40 percent but less than 60 percent impaired, limited or restricted       Problem List Patient Active Problem List   Diagnosis Date Noted  .  Dyspnea 08/31/2012  . SYNCOPE 11/21/2008  . HYPERCHOLESTEROLEMIA, PURE 05/01/2008  . HYPERTENSION, BENIGN 05/01/2008  . CAROTID ARTERY STENOSIS, WITHOUT INFARCTION 05/01/2008  . RENAL ATHEROSCLEROSIS 05/01/2008  . PVD 05/01/2008  . LONG QT SYNDROME 05/01/2008    Naylani Bradner Rober MinionP Alexsandro Salek PT, MPH  11/11/2016, 3:16 PM  [facility] [address] [place], [place], [address] Phone: [phone]   Fax:  [phone]  Name: Sherry ChuteMary Henson MRN: [id number] Date of Birth: 08-18-1927

## 2016-11-11 NOTE — Patient Instructions (Signed)
HIP: Hamstrings - Supine   Place strap around foot. Raise leg up, keeping knee straight.  Bend opposite knee to protect back if indicated. Hold 30 seconds. 3 reps per set, 2-3 sets per day   Work on standing straight and tall straighten body; hips and knees  Stand tall for 1-2 minutes  Try to stand during the commercial in a 30 minute TV show at least twice each day

## 2016-11-18 ENCOUNTER — Ambulatory Visit (INDEPENDENT_AMBULATORY_CARE_PROVIDER_SITE_OTHER): Payer: Medicare Other | Admitting: Rehabilitative and Restorative Service Providers"

## 2016-11-18 ENCOUNTER — Encounter: Payer: Self-pay | Admitting: Rehabilitative and Restorative Service Providers"

## 2016-11-18 DIAGNOSIS — R293 Abnormal posture: Secondary | ICD-10-CM

## 2016-11-18 DIAGNOSIS — R269 Unspecified abnormalities of gait and mobility: Secondary | ICD-10-CM

## 2016-11-18 DIAGNOSIS — M25561 Pain in right knee: Secondary | ICD-10-CM | POA: Diagnosis not present

## 2016-11-18 DIAGNOSIS — R531 Weakness: Secondary | ICD-10-CM

## 2016-11-19 NOTE — Therapy (Signed)
[facility] [address] [place], [place], [address] Phone: [phone]   Fax:  [phone]  Physical Therapy Treatment  Patient Details  Name: Sherry Henson MRN: [id number] Date of Birth: 1928/03/13 Referring Provider: Dr Rosie Fateebecca Henderson   Encounter Date: 11/18/2016      PT End of Session - 11/18/16 1407    Visit Number 2   Number of Visits 12   Date for PT Re-Evaluation 12/23/16   PT Start Time 1405   PT Stop Time 1500   PT Time Calculation (min) 55 min      Past Medical History:  Diagnosis Date  . Arthritis   . Asthma   . Cerebrovascular disease   . Diverticulosis   . GERD (gastroesophageal reflux disease)   . Hernia    hital  . HLD (hyperlipidemia)   . HTN (hypertension)   . Hypothyroidism   . Long Q-T syndrome   . PVD (peripheral vascular disease) (HCC)    ceiac artery and SMA stenosis, right renal artery stenosis    Past Surgical History:  Procedure Laterality Date  . APPENDECTOMY    . BUNIONECTOMY    . carpel tunnel surgery    . hysterectomy 9other)    . LAMINECTOMY    . TONSILLECTOMY    . torn rotator cuff surgery      There were no vitals filed for this visit.      Subjective Assessment - 11/18/16 1412    Subjective Sherry Henson reports continued pain in the Rt knee - mostly when she stands after having been sitting and sometimes when she is walking.    Currently in Pain? Yes   Pain Score 5    Pain Location Knee   Pain Orientation Right   Pain Descriptors / Indicators Aching   Pain Type Acute pain   Pain Onset More than a month ago   Pain Frequency Intermittent                         [facility] Adult PT Treatment/Exercise - 11/19/16 1116      Ambulation/Gait   Gait Comments ambulates with rolling walker forward flexed at trunk and hips; knees flexed      Posture/Postural Control   Posture Comments head forward; flexed forward at hips; knees flexed; forward posture      Knee/Hip Exercises: Stretches   Passive Hamstring Stretch Right;Left;3 reps;30 seconds  supine with strap      Knee/Hip Exercises: Aerobic   Nustep L4 x 5 min      Knee/Hip Exercises: Supine   Quad Sets Strengthening;Right;Left;1 set;10 reps  5 sec hold    Bridges Limitations LBP with attempt to bridge    Straight Leg Raises Strengthening;Right;Left;1 set;10 reps  initially requiring PT assist for Rt leg lift   Other Supine Knee/Hip Exercises clam holding one LE still moving opposite - green TB x 10      Moist Heat Therapy   Number Minutes Moist Heat 15 Minutes   Moist Heat Location Lumbar Spine     Cryotherapy   Number Minutes Cryotherapy 15 Minutes   Cryotherapy Location Knee  Rt    Type of Cryotherapy Ice pack                     PT Long Term Goals - 11/18/16 1411      PT LONG TERM GOAL #1   Title Improve upright posture and alignment with patient  to demonstrate increased trunk; hip and knee extension 12/23/16   Time 6   Period Weeks   Status On-going     PT LONG TERM GOAL #2   Title Increase strength bilat LE's to 5-/5 to 5/5 12/23/16   Time 6   Period Weeks   Status On-going     PT LONG TERM GOAL #3   Title Improve gait pattern with increased safety - improve Berg balance score by 3-7 points thus decreasing risk for falling 12/23/16   Time 6   Status On-going     PT LONG TERM GOAL #4   Title Independent in HEP 12/23/16   Time 6   Period Weeks   Status On-going     PT LONG TERM GOAL #5   Title Improve FOTO to </= 52% limitation 12/23/16   Time 6   Period Weeks   Status On-going               Plan - 11/18/16 1414    Clinical Impression Statement Sherry Henson continues to have Rt knee pain interferring with functional activities. She has limited mobility; poor upright posture and abnormal gait pattern. No goals of therapy accomplished - only second visit.    Rehab Potential Good   PT Frequency 2x / week   PT Duration 6 weeks   PT  Treatment/Interventions Patient/family education;ADLs/Self Care Home Management;Cryotherapy;Electrical Stimulation;Iontophoresis 4mg /ml Dexamethasone;Moist Heat;Ultrasound;Dry needling;Manual techniques;Therapeutic activities;Therapeutic exercise;Neuromuscular re-education;Gait training;Balance training   PT Next Visit Plan progress with stretching; stretch hip flexors; standing balance and neuromuscular re-ed; strengthening; gait training; modalities for Rt knee pain    Consulted and Agree with Plan of Care Patient      Patient will benefit from skilled therapeutic intervention in order to improve the following deficits and impairments:  Postural dysfunction, Improper body mechanics, Pain, Decreased range of motion, Decreased mobility, Decreased strength, Abnormal gait, Increased fascial restricitons, Decreased activity tolerance  Visit Diagnosis: Acute pain of right knee  Weakness generalized  Abnormal posture  Abnormal gait     Problem List Patient Active Problem List   Diagnosis Date Noted  . Dyspnea 08/31/2012  . SYNCOPE 11/21/2008  . HYPERCHOLESTEROLEMIA, PURE 05/01/2008  . HYPERTENSION, BENIGN 05/01/2008  . CAROTID ARTERY STENOSIS, WITHOUT INFARCTION 05/01/2008  . RENAL ATHEROSCLEROSIS 05/01/2008  . PVD 05/01/2008  . LONG QT SYNDROME 05/01/2008    Sherry Henson Rober Minion PT, MPH  11/19/2016, 11:19 AM  [facility] [address] [place], [place], [address] Phone: [phone]   Fax:  [phone]  Name: Sherry Henson MRN: [id number] Date of Birth: 06-27-1927

## 2016-11-20 ENCOUNTER — Encounter: Payer: Self-pay | Admitting: Physical Therapy

## 2016-11-20 ENCOUNTER — Ambulatory Visit (INDEPENDENT_AMBULATORY_CARE_PROVIDER_SITE_OTHER): Payer: Medicare Other | Admitting: Physical Therapy

## 2016-11-20 DIAGNOSIS — M2560 Stiffness of unspecified joint, not elsewhere classified: Secondary | ICD-10-CM

## 2016-11-20 DIAGNOSIS — R531 Weakness: Secondary | ICD-10-CM

## 2016-11-20 DIAGNOSIS — R293 Abnormal posture: Secondary | ICD-10-CM | POA: Diagnosis not present

## 2016-11-20 DIAGNOSIS — M25561 Pain in right knee: Secondary | ICD-10-CM | POA: Diagnosis not present

## 2016-11-20 DIAGNOSIS — R269 Unspecified abnormalities of gait and mobility: Secondary | ICD-10-CM

## 2016-11-20 NOTE — Therapy (Addendum)
[address] [place], [place], [address] Phone: [phone]   Fax:  [phone]  Physical Therapy Treatment  Patient Details  Name: Sherry Henson MRN: [id number] Date of Birth: May 14, 1927 Referring Provider: Dr Aurea Graff   Encounter Date: 11/20/2016      PT End of Session - 11/20/16 1530    Visit Number 3   Number of Visits 12   Date for PT Re-Evaluation 12/23/16   PT Start Time 0630   PT Stop Time 1547   PT Time Calculation (min) 48 min   Activity Tolerance Patient tolerated treatment well      Past Medical History:  Diagnosis Date  . Arthritis   . Asthma   . Cerebrovascular disease   . Diverticulosis   . GERD (gastroesophageal reflux disease)   . Hernia    hital  . HLD (hyperlipidemia)   . HTN (hypertension)   . Hypothyroidism   . Long Q-T syndrome   . PVD (peripheral vascular disease) ([place])    ceiac artery and SMA stenosis, right renal artery stenosis    Past Surgical History:  Procedure Laterality Date  . APPENDECTOMY    . BUNIONECTOMY    . carpel tunnel surgery    . hysterectomy 9other)    . LAMINECTOMY    . TONSILLECTOMY    . torn rotator cuff surgery      There were no vitals filed for this visit.      Subjective Assessment - 11/20/16 1458    Subjective Pt and husband report she is a little tired today as she did a lot of walking in the park earlier today   Patient Stated Goals get rid of some of the knee pain    Currently in Pain? Yes   Pain Score 5    Pain Location Knee   Pain Orientation Right   Pain Descriptors / Indicators Stabbing   Pain Type Acute pain   Pain Onset More than a month ago   Pain Frequency Intermittent   Aggravating Factors  walking    Pain Relieving Factors ice and heat            [facility] PT Assessment - 11/20/16 0001      Assessment   Medical Diagnosis Rt knee pain                      [facility] Adult PT Treatment/Exercise  - 11/20/16 0001      Knee/Hip Exercises: Supine   Quad Sets Strengthening;Right;10 reps   Other Supine Knee/Hip Exercises leg lengtheners & leg press x 10      Modalities   Modalities Moist Heat;Ultrasound     Moist Heat Therapy   Number Minutes Moist Heat 15 Minutes   Moist Heat Location Knee;Lumbar Spine  Rt knee     Ultrasound   Ultrasound Location Rt anterior knee   Ultrasound Parameters 100% 1.89mz, 1.5 w/cm2   Ultrasound Goals Pain     Manual Therapy   Manual Therapy Myofascial release;Soft tissue mobilization;Passive ROM   Soft tissue mobilization Rt quad, ITB, TFL and hamstring in s/l with PROM   Myofascial Release Rt quad, ITB, hamstring   Passive ROM Rt hip ext and knee flex in s/l leg supported on wedge.                      PT Long Term Goals - 11/18/16 1411      PT LONG  TERM GOAL #1   Title Improve upright posture and alignment with patient to demonstrate increased trunk; hip and knee extension 12/23/16   Time 6   Period Weeks   Status On-going     PT LONG TERM GOAL #2   Title Increase strength bilat LE's to 5-/5 to 5/5 12/23/16   Time 6   Period Weeks   Status On-going     PT LONG TERM GOAL #3   Title Improve gait pattern with increased safety - improve Berg balance score by 3-7 points thus decreasing risk for falling 12/23/16   Time 6   Status On-going     PT LONG TERM GOAL #4   Title Independent in HEP 12/23/16   Time 6   Period Weeks   Status On-going     PT LONG TERM GOAL #5   Title Improve FOTO to </= 52% limitation 12/23/16   Time 6   Period Weeks   Status On-going               Plan - 11/20/16 1530    Clinical Impression Statement Serrita is very fatigued today after doing a lot of walking at the park this AM.  She tolerated todays treatment well and was able to stand taller at end of the session. No new goals met.    Rehab Potential Good   PT Frequency 2x / week   PT Duration 6 weeks   PT Treatment/Interventions  Patient/family education;ADLs/Self Care Home Management;Cryotherapy;Electrical Stimulation;Iontophoresis 68m/ml Dexamethasone;Moist Heat;Ultrasound;Dry needling;Manual techniques;Therapeutic activities;Therapeutic exercise;Neuromuscular re-education;Gait training;Balance training   PT Next Visit Plan progress with stretching; stretch hip flexors; standing balance and neuromuscular re-ed; strengthening; gait training; modalities for Rt knee pain    Consulted and Agree with Plan of Care Patient      Patient will benefit from skilled therapeutic intervention in order to improve the following deficits and impairments:  Postural dysfunction, Improper body mechanics, Pain, Decreased range of motion, Decreased mobility, Decreased strength, Abnormal gait, Increased fascial restricitons, Decreased activity tolerance  Visit Diagnosis: Acute pain of right knee  Weakness generalized  Abnormal posture  Abnormal gait  Stiffness of multiple joints     Problem List Patient Active Problem List   Diagnosis Date Noted  . Dyspnea 08/31/2012  . SYNCOPE 11/21/2008  . HYPERCHOLESTEROLEMIA, PURE 05/01/2008  . HYPERTENSION, BENIGN 05/01/2008  . CAROTID ARTERY STENOSIS, WITHOUT INFARCTION 05/01/2008  . RENAL ATHEROSCLEROSIS 05/01/2008  . PVD 05/01/2008  . LONG QT SYNDROME 05/01/2008    SJeral PinchPT  11/20/2016, 3:33 PM  [facility] [address]Phone: [phone]  Fax:  [phone] Name: MShyloh DerosaMRN: [id number] of Birth: [birth year]

## 2016-11-24 ENCOUNTER — Encounter: Payer: Self-pay | Admitting: Rehabilitative and Restorative Service Providers"

## 2016-11-24 ENCOUNTER — Encounter: Payer: Medicare Other | Admitting: Rehabilitative and Restorative Service Providers"

## 2016-11-24 ENCOUNTER — Ambulatory Visit (INDEPENDENT_AMBULATORY_CARE_PROVIDER_SITE_OTHER): Payer: Medicare Other | Admitting: Rehabilitative and Restorative Service Providers"

## 2016-11-24 DIAGNOSIS — R269 Unspecified abnormalities of gait and mobility: Secondary | ICD-10-CM | POA: Diagnosis not present

## 2016-11-24 DIAGNOSIS — R293 Abnormal posture: Secondary | ICD-10-CM | POA: Diagnosis not present

## 2016-11-24 DIAGNOSIS — R531 Weakness: Secondary | ICD-10-CM | POA: Diagnosis not present

## 2016-11-24 DIAGNOSIS — M25561 Pain in right knee: Secondary | ICD-10-CM | POA: Diagnosis not present

## 2016-11-24 NOTE — Therapy (Signed)
[facility] [address] [place], [place], [address] Phone: [phone]   Fax:  [phone]  Physical Therapy Treatment  Patient Details  Name: Sherry Henson MRN: [id number] Date of Birth: 1928-04-04 Referring Provider: Dr Rosie FateEbecca Henderson   Encounter Date: 11/24/2016      PT End of Session - 11/24/16 1149    Visit Number 4   Number of Visits 12   Date for PT Re-Evaluation 12/23/16   PT Start Time 1149   PT Stop Time 1241   PT Time Calculation (min) 52 min   Activity Tolerance Patient tolerated treatment well      Past Medical History:  Diagnosis Date  . Arthritis   . Asthma   . Cerebrovascular disease   . Diverticulosis   . GERD (gastroesophageal reflux disease)   . Hernia    hital  . HLD (hyperlipidemia)   . HTN (hypertension)   . Hypothyroidism   . Long Q-T syndrome   . PVD (peripheral vascular disease) (HCC)    ceiac artery and SMA stenosis, right renal artery stenosis    Past Surgical History:  Procedure Laterality Date  . APPENDECTOMY    . BUNIONECTOMY    . carpel tunnel surgery    . hysterectomy 9other)    . LAMINECTOMY    . TONSILLECTOMY    . torn rotator cuff surgery      There were no vitals filed for this visit.      Subjective Assessment - 11/24/16 1150    Subjective Celebrating her 89th birthday today! Knee "feels terrible" today. COPD "acting up" today.    Currently in Pain? Yes   Pain Score 6    Pain Location Knee   Pain Orientation Right   Pain Descriptors / Indicators Stabbing   Pain Type Acute pain   Pain Onset More than a month ago            [facility] - 11/24/16 0001      Assessment   Medical Diagnosis Rt knee pain    Referring Provider Dr Rosie FateEbecca Henderson    Onset Date/Surgical Date 07/26/16   Hand Dominance Right   Next MD Visit 8/18   Prior Therapy yes - 2016     AROM   Right Knee Extension -9   Right Knee Flexion 118   Left Knee Extension  -7   Left Knee Flexion 126     Strength   Right/Left Hip --  grossly 4+/5 except hip ext/abd 4/5 bilat    Right/Left Knee --  4+/5 bilat      Flexibility   Hamstrings tight Rt 70 deg; Lt 75 deg    Quadriceps unable to assess in prone - tight bilat in sidelying    ITB tight bilat                      [facility] Adult PT Treatment/Exercise - 11/24/16 0001      Knee/Hip Exercises: Stretches   Passive Hamstring Stretch Right;Left;3 reps;30 seconds  w/ strap and PT assist    Hip Flexor Stretch Right;Left;3 reps;30 seconds  sidelying PT assist      Knee/Hip Exercises: Aerobic   Nustep L4 x 5 min      Knee/Hip Exercises: Supine   Quad Sets Strengthening;Right;10 reps   Straight Leg Raises Strengthening;Right;Left;1 set;10 reps   Other Supine Knee/Hip Exercises  clam holding one LE still moving opposite green TB 10 reps x 2 sets  Other Supine Knee/Hip Exercises ball adductor squeeze x 10 reps x 2 sets      Knee/Hip Exercises: Sidelying   Clams 10 reps each side    Other Sidelying Knee/Hip Exercises assisted hip extension LE resting on bolster x 10; hip protractionLE supported in extension x 10      Moist Heat Therapy   Number Minutes Moist Heat 15 Minutes   Moist Heat Location Lumbar Spine     Cryotherapy   Number Minutes Cryotherapy 15 Minutes   Cryotherapy Location Knee  Rt    Type of Cryotherapy Ice pack     Manual Therapy   Passive ROM Rt hip ext with knee flex in s/l leg supported on bolster PT assist x 30 sec x 3 reps                      PT Long Term Goals - 11/24/16 1148      PT LONG TERM GOAL #1   Title Improve upright posture and alignment with patient to demonstrate increased trunk; hip and knee extension 12/23/16   Time 6   Period Weeks   Status On-going     PT LONG TERM GOAL #2   Title Increase strength bilat LE's to 5-/5 to 5/5 12/23/16   Time 6   Period Weeks   Status On-going     PT LONG TERM GOAL #3   Title Improve gait  pattern with increased safety - improve Berg balance score by 3-7 points thus decreasing risk for falling 12/23/16   Time 6   Period Weeks   Status On-going     PT LONG TERM GOAL #4   Title Independent in HEP 12/23/16   Time 6   Period Weeks   Status On-going     PT LONG TERM GOAL #5   Title Improve FOTO to </= 52% limitation 12/23/16   Time 6   Period Weeks   Status On-going               Plan - 11/24/16 1157    Clinical Impression Statement Persistent pain and limitations in Rt knee ROM/strength. Patient is ambulating with rolling walker w/ back leg sliders. Sherry Henson requires breaks for rest during therapy. Anticipate slow progress due to significant arthritic changes Rt knee.    Rehab Potential Good   PT Frequency 2x / week   PT Duration 6 weeks   PT Treatment/Interventions Patient/family education;ADLs/Self Care Home Management;Cryotherapy;Electrical Stimulation;Iontophoresis 4mg /ml Dexamethasone;Moist Heat;Ultrasound;Dry needling;Manual techniques;Therapeutic activities;Therapeutic exercise;Neuromuscular re-education;Gait training;Balance training   PT Next Visit Plan progress with stretching; stretch hip flexors; standing balance and neuromuscular re-ed; strengthening; gait training; modalities for Rt knee pain as indicated    Consulted and Agree with Plan of Care Patient      Patient will benefit from skilled therapeutic intervention in order to improve the following deficits and impairments:  Postural dysfunction, Improper body mechanics, Pain, Decreased range of motion, Decreased mobility, Decreased strength, Abnormal gait, Increased fascial restricitons, Decreased activity tolerance  Visit Diagnosis: Acute pain of right knee  Weakness generalized  Abnormal posture  Abnormal gait     Problem List Patient Active Problem List   Diagnosis Date Noted  . Dyspnea 08/31/2012  . SYNCOPE 11/21/2008  . HYPERCHOLESTEROLEMIA, PURE 05/01/2008  . HYPERTENSION, BENIGN  05/01/2008  . CAROTID ARTERY STENOSIS, WITHOUT INFARCTION 05/01/2008  . RENAL ATHEROSCLEROSIS 05/01/2008  . PVD 05/01/2008  . LONG QT SYNDROME 05/01/2008    Sherry Henson Rober Minion PT, MPH  11/24/2016, 12:48 PM  [facility] [address] [place], [place], [address] Phone: [phone]   Fax:  [phone]  Name: Sherry Henson MRN: [id number] Date of Birth: 1927-12-09

## 2016-11-26 ENCOUNTER — Ambulatory Visit (INDEPENDENT_AMBULATORY_CARE_PROVIDER_SITE_OTHER): Payer: Medicare Other | Admitting: Physical Therapy

## 2016-11-26 DIAGNOSIS — R293 Abnormal posture: Secondary | ICD-10-CM | POA: Diagnosis not present

## 2016-11-26 DIAGNOSIS — R269 Unspecified abnormalities of gait and mobility: Secondary | ICD-10-CM | POA: Diagnosis not present

## 2016-11-26 DIAGNOSIS — M25561 Pain in right knee: Secondary | ICD-10-CM | POA: Diagnosis not present

## 2016-11-26 DIAGNOSIS — R531 Weakness: Secondary | ICD-10-CM | POA: Diagnosis not present

## 2016-11-26 NOTE — Therapy (Addendum)
[place], [place], [address] Phone: [phone]   Fax:  [phone]  Physical Therapy Treatment  Patient Details  Name: Sherry Henson MRN: [id number] Date of Birth: Feb 07, 1928 Referring Provider: Dr. Aurea Graff  Encounter Date: 11/26/2016      PT End of Session - 11/26/16 1024    Visit Number 5   Number of Visits 12   Date for PT Re-Evaluation 12/23/16   PT Start Time 1020   PT Stop Time 1112  ice/heat last 12 min    PT Time Calculation (min) 52 min   Activity Tolerance Patient tolerated treatment well      Past Medical History:  Diagnosis Date  . Arthritis   . Asthma   . Cerebrovascular disease   . Diverticulosis   . GERD (gastroesophageal reflux disease)   . Hernia    hital  . HLD (hyperlipidemia)   . HTN (hypertension)   . Hypothyroidism   . Long Q-T syndrome   . PVD (peripheral vascular disease) ([place])    ceiac artery and SMA stenosis, right renal artery stenosis    Past Surgical History:  Procedure Laterality Date  . APPENDECTOMY    . BUNIONECTOMY    . carpel tunnel surgery    . hysterectomy 9other)    . LAMINECTOMY    . TONSILLECTOMY    . torn rotator cuff surgery      There were no vitals filed for this visit.      Subjective Assessment - 11/26/16 1025    Subjective pt reports her (Rt) knee continues to feel "terrible".  She ambulates into therapy with RW and antalgic gait. Nothing new to report.    Patient Stated Goals get rid of some of the knee pain    Currently in Pain? Yes   Pain Score 6    Pain Location Knee   Pain Orientation Right   Pain Descriptors / Indicators Sharp;Stabbing   Aggravating Factors  walking    Pain Relieving Factors ice/heat             [facility] PT Assessment - 11/26/16 0001      Assessment   Medical Diagnosis Rt knee pain    Referring Provider Dr. Aurea Graff   Onset Date/Surgical Date 07/26/16   Hand Dominance  Right   Next MD Visit 8/18          [facility] Adult PT Treatment/Exercise - 11/26/16 0001      Knee/Hip Exercises: Stretches   Hip Flexor Stretch Right;Left;3 reps;20 seconds  standing with leg ext, hands on window sill   Gastroc Stretch Right;Left;2 reps;20 seconds     Knee/Hip Exercises: Aerobic   Nustep L3: 5 min      Knee/Hip Exercises: Standing   Other Standing Knee Exercises mini squat with focus on posture, with focus on straight knees and hips on return x 5 reps (slow);   semi tandem stance with light UE support from PTA x 20 sec each leg.  Side stepping with UE support at window x 10 with tactile cues for posture.      Knee/Hip Exercises: Seated   Long Arc Quad AROM;Left;Right;1 set;10 reps   Sit to Sand 5 reps- with UE support. Increased time to standing     Moist Heat Therapy   Number Minutes Moist Heat 12 Minutes   Moist Heat Location Lumbar Spine     Cryotherapy   Number Minutes Cryotherapy 12 Minutes   Cryotherapy Location Knee  Rt    Type of Cryotherapy Ice pack     Manual Therapy   Soft tissue mobilization Light STM to Lt quad, with pt in sitting                     PT Long Term Goals - 11/24/16 1148      PT LONG TERM GOAL #1   Title Improve upright posture and alignment with patient to demonstrate increased trunk; hip and knee extension 12/23/16   Time 6   Period Weeks   Status On-going     PT LONG TERM GOAL #2   Title Increase strength bilat LE's to 5-/5 to 5/5 12/23/16   Time 6   Period Weeks   Status On-going     PT LONG TERM GOAL #3   Title Improve gait pattern with increased safety - improve Berg balance score by 3-7 points thus decreasing risk for falling 12/23/16   Time 6   Period Weeks   Status On-going     PT LONG TERM GOAL #4   Title Independent in HEP 12/23/16   Time 6   Period Weeks   Status On-going     PT LONG TERM GOAL #5   Title Improve FOTO to </= 52% limitation 12/23/16   Time 6   Period Weeks   Status On-going                Plan - 11/26/16 1106    Clinical Impression Statement Pt continues to report increased pain with any use of Rt knee. Pt reported decreased pain in Rt knee upon rising/lowering when putting Rt foot forward (staggered stance).   Educated pt and her husband to try this technique at home to decrease discomfort.   Pt requires freq seated rest breaks due to fatigue. Pt making gradual gains towards goals.    Rehab Potential Good   PT Frequency 2x / week   PT Duration 6 weeks   PT Treatment/Interventions Patient/family education;ADLs/Self Care Home Management;Cryotherapy;Electrical Stimulation;Iontophoresis 79m/ml Dexamethasone;Moist Heat;Ultrasound;Dry needling;Manual techniques;Therapeutic activities;Therapeutic exercise;Neuromuscular re-education;Gait training;Balance training   PT Next Visit Plan progress with stretching; stretch hip flexors; standing balance and neuromuscular re-ed; strengthening; gait training; modalities for Rt knee pain as indicated    Consulted and Agree with Plan of Care Patient      Patient will benefit from skilled therapeutic intervention in order to improve the following deficits and impairments:  Postural dysfunction, Improper body mechanics, Pain, Decreased range of motion, Decreased mobility, Decreased strength, Abnormal gait, Increased fascial restricitons, Decreased activity tolerance  Visit Diagnosis: Acute pain of right knee  Weakness generalized  Abnormal posture  Abnormal gait     Problem List Patient Active Problem List   Diagnosis Date Noted  . Dyspnea 08/31/2012  . SYNCOPE 11/21/2008  . HYPERCHOLESTEROLEMIA, PURE 05/01/2008  . HYPERTENSION, BENIGN 05/01/2008  . CAROTID ARTERY STENOSIS, WITHOUT INFARCTION 05/01/2008  . RENAL ATHEROSCLEROSIS 05/01/2008  . PVD 05/01/2008  . LONG QT SYNDROME 05/01/2008   JKerin Perna PTA 11/26/16 12:04 PM  [place] [address]Phone: [phone]  Fax:  [phone] Name: MThurley FrancesconiMRN: [id number] of Birth: [birth year] PHYSICAL THERAPY DISCHARGE SUMMARY  Visits from Start of Care: 5  Current functional level related to goals / functional outcomes: See last progress note for discharge status   Remaining deficits: unknown   Education / Equipment: HEP  Plan: Patient agrees to discharge.  Patient goals were partially met. Patient is being discharged due to being pleased with the current functional level.  ?????     Celyn P. Helene Kelp PT, MPH 12/28/16 10:36 AM

## 2018-06-03 ENCOUNTER — Ambulatory Visit: Payer: Medicare Other | Admitting: Rehabilitative and Restorative Service Providers"

## 2018-06-09 ENCOUNTER — Ambulatory Visit: Payer: Medicare Other | Admitting: Rehabilitative and Restorative Service Providers"

## 2018-06-09 ENCOUNTER — Other Ambulatory Visit: Payer: Self-pay

## 2018-06-09 ENCOUNTER — Encounter: Payer: Self-pay | Admitting: Rehabilitative and Restorative Service Providers"

## 2018-06-09 DIAGNOSIS — R269 Unspecified abnormalities of gait and mobility: Secondary | ICD-10-CM | POA: Diagnosis not present

## 2018-06-09 DIAGNOSIS — R293 Abnormal posture: Secondary | ICD-10-CM

## 2018-06-09 DIAGNOSIS — R531 Weakness: Secondary | ICD-10-CM

## 2018-06-09 DIAGNOSIS — M2560 Stiffness of unspecified joint, not elsewhere classified: Secondary | ICD-10-CM

## 2018-06-09 DIAGNOSIS — M25561 Pain in right knee: Secondary | ICD-10-CM

## 2018-06-09 NOTE — Patient Instructions (Signed)
Access Code: [email or web address]  URL: [email or web address]  Date: 06/09/2018  Prepared by: Corlis Leak   Exercises  Supine Hamstring Stretch with Strap - 3 reps - 1 sets - 30 seconds hold - 2x daily - 7x weekly  Supine Quad Set - 10 reps - 2 sets - 5 sec hold - 2x daily - 7x weekly  Sit to Stand with Counter Support - 10 reps - 1 sets - 10 sec hold - 2x daily - 7x weekly

## 2018-06-09 NOTE — Therapy (Signed)
[facility] [place] [address] [place], [place], [address] Phone: [phone]   Fax:  [phone]  Physical Therapy Evaluation  Patient Details  Name: Sherry Henson MRN: [id number] Date of Birth: [birth year] Referring Provider (PT): Dr Rosie Fate    Encounter Date: 06/09/2018  PT End of Session - 06/09/18 1539    Visit Number  1    Number of Visits  12    Date for PT Re-Evaluation  07/21/18    PT Start Time  1540    PT Stop Time  1632    PT Time Calculation (min)  52 min    Activity Tolerance  Patient tolerated treatment well       Past Medical History:  Diagnosis Date  . Arthritis   . Asthma   . Cerebrovascular disease   . Diverticulosis   . GERD (gastroesophageal reflux disease)   . Hernia    hital  . HLD (hyperlipidemia)   . HTN (hypertension)   . Hypothyroidism   . Long Q-T syndrome   . PVD (peripheral vascular disease) (HCC)    ceiac artery and SMA stenosis, right renal artery stenosis    Past Surgical History:  Procedure Laterality Date  . APPENDECTOMY    . BUNIONECTOMY    . carpel tunnel surgery    . hysterectomy 9other)    . LAMINECTOMY    . TONSILLECTOMY    . torn rotator cuff surgery      There were no vitals filed for this visit.   Subjective Assessment - 06/09/18 1545    Subjective  Patient reports that she has had Rt knee pain for the past 3-4 years. She received treatment in 8/18 for several visits of PT with only some improvement. Knee pain has been treated with injections but continues to have knee pain limiting functional activities.     Pertinent History  arthritis; asthma; COPD; HTN; several surgeries; insomnia; neck pain; hiatial hernia; gall blader removed a couple of years ago     Diagnostic tests  xrays     Patient Stated Goals  get the pain better; and walk better     Currently in Pain?  Yes    Pain Score  5     Pain Location  Knee    Pain Orientation  Right    Pain  Descriptors / Indicators  Dull;Constant    Pain Type  Chronic pain    Pain Onset  More than a month ago    Pain Frequency  Constant    Aggravating Factors   standing; moving; walking sit to stand     Pain Relieving Factors  meds - topical analgesic          [facility] PT Assessment - 06/09/18 0001      Assessment   Medical Diagnosis  Chronic Rt knee pain     Referring Provider (PT)  Dr Rosie Fate     Onset Date/Surgical Date  12/12/17   pain increasing over the past 2-3 years    Hand Dominance  Right    Next MD Visit  2-3 months     Prior Therapy  yes here 2016 and 2018      Precautions   Precautions  None      Restrictions   Weight Bearing Restrictions  No      Balance Screen   Has the patient fallen in the past 6 months  No    Has the patient had a decrease in  activity level because of a fear of falling?   No    Is the patient reluctant to leave their home because of a fear of falling?   No      Home Environment   Living Environment  Private residence    Living Arrangements  Spouse/significant other    Type of Home  House    Home Access  Stairs to enter    Entrance Stairs-Number of Steps  1    Entrance Stairs-Rails  Can reach both;Right;Left    Home Layout  One level    Additional Comments  adaptive bathroom equipment; rollator       Prior Function   Level of Independence  Independent    Vocation  Retired    Art therapist - retired 1988    Leisure  sedentary       Observation/Other Assessments   Focus on Therapeutic Outcomes (FOTO)   67% limitation       Posture/Postural Control   Posture Comments  forward flexed posture in standing and with walking       AROM   Right Hip Extension  -20    Right Hip Flexion  --   WFL's    Right Hip External Rotation   --   tight end range   Right Hip Internal Rotation   --   tight end range   Right Hip ABduction  --   WFL's    Right Hip ADduction  --   WFL's   Left Hip Extension  -20    Left  Hip Flexion  --   WFL's   Left Hip External Rotation   --   tight end range   Left Hip Internal Rotation   --   tigth end range   Left Hip ABduction  --   WFL's    Left Hip ADduction  --   WFL's   Right Knee Extension  -14    Right Knee Flexion  113    Left Knee Extension  -9    Left Knee Flexion  119      Strength   Right/Left Hip  --   assessed in supine pt unable to lie prone    Right Hip Flexion  4-/5    Right Hip Extension  3+/5    Right Hip ABduction  4-/5    Left Hip Flexion  4+/5    Left Hip Extension  4-/5    Left Hip ABduction  4/5    Right/Left Knee  --   assessed in sitting    Right Knee Flexion  4/5    Right Knee Extension  4-/5    Left Knee Flexion  4+/5    Left Knee Extension  4/5      Flexibility   Hamstrings  tight bilat       Bed Mobility   Bed Mobility  --   difficulty w/sit to supine and supine to sit required assist     Transfers   Comments  difficulty with transitional movement and tansfers       Ambulation/Gait   Ambulation/Gait  Yes    Ambulation/Gait Assistance  5: Supervision    Ambulation Distance (Feet)  40 Feet   x2   Assistive device  4-wheeled walker    Gait Pattern  --   flexed forward posture; small steps    Ambulation Surface  Level    Gait velocity  slowed     Gait Comments  flexed forward  posture at trunk; hips and knees                 Objective measurements completed on examination: See above findings.      [facility] Adult PT Treatment/Exercise - 06/09/18 0001      Knee/Hip Exercises: Stretches   Passive Hamstring Stretch  Right;Left;2 reps;30 seconds   supine w/strap - required PT assist for lifting/holding leg     Knee/Hip Exercises: Seated   Sit to Sand  with UE support   2 reps - from elevated surface      Knee/Hip Exercises: Supine   Quad Sets  AROM;Strengthening;Right;Left;5 reps   5 sec hold                 PT Long Term Goals - 06/09/18 1640      PT LONG TERM GOAL #1   Title   Improve upright posture and alignment with patient to demonstrate increased trunk; hip and knee extension 07/20/2018    Time  6    Period  Weeks    Status  New      PT LONG TERM GOAL #2   Title  Increase strength bilat LE's to 4/5 to 5-/5 target date 07/20/2018    Time  6    Period  Weeks    Status  New      PT LONG TERM GOAL #3   Title  Improve gait pattern with increased safety with rolling walker to improve independence and decresae risk of falls 07/20/2018    Time  6    Period  Weeks    Status  New      PT LONG TERM GOAL #4   Title  Independent in HEP 07/20/2018    Time  6    Period  Weeks    Status  New      PT LONG TERM GOAL #5   Title  Improve FOTO to </=58% limitation 07/20/2018    Time  6    Period  Weeks    Status  New             Plan - 06/09/18 1617    Clinical Impression Statement  Nina presents with chroinc Rt knee pain with symptoms including pain; limited ROM; decreased strength; difficulty with transfers; abnormal/dependent gait pattern; decresaed independence. Patient will benefit from PT to address probems identifies.     History and Personal Factors relevant to plan of care:  chronic Rt knee pain; arthritic changes; sedentary lifestyle    Clinical Presentation  Evolving    Clinical Decision Making  Low    Rehab Potential  Fair    Clinical Impairments Affecting Rehab Potential  chronic nature of condition; sedentary lifestylr    PT Frequency  2x / week    PT Treatment/Interventions  Patient/family education;ADLs/Self Care Home Management;Cryotherapy;Electrical Stimulation;Iontophoresis 4mg /ml Dexamethasone;Moist Heat;Ultrasound;Dry needling;Manual techniques;Neuromuscular re-education;Balance training;Therapeutic exercise;Therapeutic activities;Gait training    PT Next Visit Plan  HEP; progress with ROm and strengthening Rt LE; modalities as indicated     PT Home Exercise Plan  Access Code: [email or web address]     Consulted and Agree with Plan of Care  Patient        Patient will benefit from skilled therapeutic intervention in order to improve the following deficits and impairments:  Pain, Postural dysfunction, Improper body mechanics, Increased fascial restricitons, Increased muscle spasms, Decreased strength, Decreased mobility, Decreased range of motion, Decreased activity tolerance  Visit Diagnosis: Acute pain of right knee - Plan: PT  plan of care cert/re-cert  Weakness generalized - Plan: PT plan of care cert/re-cert  Abnormal posture - Plan: PT plan of care cert/re-cert  Abnormal gait - Plan: PT plan of care cert/re-cert  Stiffness of multiple joints - Plan: PT plan of care cert/re-cert     Problem List Patient Active Problem List   Diagnosis Date Noted  . Dyspnea 08/31/2012  . SYNCOPE 11/21/2008  . HYPERCHOLESTEROLEMIA, PURE 05/01/2008  . HYPERTENSION, BENIGN 05/01/2008  . CAROTID ARTERY STENOSIS, WITHOUT INFARCTION 05/01/2008  . RENAL ATHEROSCLEROSIS 05/01/2008  . PVD 05/01/2008  . LONG QT SYNDROME 05/01/2008    Celyn Rober Minion PT, MPH  06/09/2018, 4:44 PM  [facility] [address] [place], [place], [address] Phone: [phone]   Fax:  [phone]  Name: Sherry Henson MRN: [id number] Date of Birth: 06/07/1927

## 2018-06-13 ENCOUNTER — Encounter: Payer: Self-pay | Admitting: Emergency Medicine

## 2018-06-13 ENCOUNTER — Emergency Department
Admission: EM | Admit: 2018-06-13 | Discharge: 2018-06-13 | Disposition: A | Discharge status: Home / Self Care | Payer: Medicare Other | Source: Home / Self Care

## 2018-06-13 DIAGNOSIS — H60391 Other infective otitis externa, right ear: Secondary | ICD-10-CM | POA: Diagnosis not present

## 2018-06-13 DIAGNOSIS — T161XXA Foreign body in right ear, initial encounter: Secondary | ICD-10-CM | POA: Diagnosis not present

## 2018-06-13 MED ORDER — CEPHALEXIN 500 MG PO CAPS: 500.0000 mg | ORAL_CAPSULE | Freq: Two times a day (BID) | ORAL | 0 refills | Status: AC

## 2018-06-13 NOTE — ED Triage Notes (Signed)
Pt states she tried to remove a ear ring from her rigth earlobe and it is stuck. This happened about 6 days ago. Painful and swollen.

## 2018-06-13 NOTE — Discharge Instructions (Signed)
°  Keep wound clean with warm water and mild soap.  You should wait until the skin has healed completely, at least a full 1-2 weeks until you try putting earrings back in.   You should avoid using small tiny stud earrings as you will likely have the same complication. Clip-on or hoop type earrings would be safest.  Please return to Urgent Care if symptoms not improving in 4-5 days, sooner if significantly worsening.

## 2018-06-13 NOTE — ED Provider Notes (Signed)
[facility]    CSN: [id number] Arrival date & time: 06/13/18  1625     History   Chief Complaint Chief Complaint  Patient presents with  . Foreign Body    HPI Sherry Henson is a 85 y.o. female.   HPI Sherry Henson is a 85 y.o. female presenting to UC with c/o Right earlobe pain after having her gold stud earring get stuck in her earlobe about 6 days ago.  Pain is aching and sore. Her husband and daughter both attempted to push it out w/o success. They have applied alcohol and neosporin with mild relief.    Past Medical History:  Diagnosis Date  . Arthritis   . Asthma   . Cerebrovascular disease   . Diverticulosis   . GERD (gastroesophageal reflux disease)   . Hernia    hital  . HLD (hyperlipidemia)   . HTN (hypertension)   . Hypothyroidism   . Long Q-T syndrome   . PVD (peripheral vascular disease) (HCC)    ceiac artery and SMA stenosis, right renal artery stenosis    Patient Active Problem List   Diagnosis Date Noted  . Dyspnea 08/31/2012  . SYNCOPE 11/21/2008  . HYPERCHOLESTEROLEMIA, PURE 05/01/2008  . HYPERTENSION, BENIGN 05/01/2008  . CAROTID ARTERY STENOSIS, WITHOUT INFARCTION 05/01/2008  . RENAL ATHEROSCLEROSIS 05/01/2008  . PVD 05/01/2008  . LONG QT SYNDROME 05/01/2008    Past Surgical History:  Procedure Laterality Date  . APPENDECTOMY    . BUNIONECTOMY    . carpel tunnel surgery    . hysterectomy 9other)    . LAMINECTOMY    . TONSILLECTOMY    . torn rotator cuff surgery      OB History   No obstetric history on file.      Home Medications    Prior to Admission medications   Medication Sig Start Date End Date Taking? Authorizing Provider  aspirin 81 MG tablet Take 81 mg by mouth daily.      [provider]  atenolol (TENORMIN) 25 MG tablet Take 1 tablet (25 mg total) by mouth daily. MUST HAVE APPOINTMENT FOR FURTHER REFILLS, [id number] 12/02/15   Lewayne Bunting, MD  cephALEXin (KEFLEX) 500 MG capsule Take 1  capsule (500 mg total) by mouth 2 (two) times daily. 06/13/18   Lurene Shadow, PA-C  estrogens, conjugated, (PREMARIN) 0.45 MG tablet Take 0.45 mg by mouth daily. Take daily for 21 days then do not take for 7 days.     [provider]  furosemide (LASIX) 20 MG tablet Take 20 mg by mouth daily.      [provider]  hydroquinone 4 % cream Apply 1 application topically 2 (two) times daily.    [provider]  levalbuterol Pauline Aus) 0.63 MG/3ML nebulizer solution Take 1 ampule by nebulization as needed.      [provider]  levothyroxine (SYNTHROID) 100 MCG tablet Take 100 mcg for 6 days then take 1/2 tablet on the 7th day    [provider]  losartan (COZAAR) 100 MG tablet Take 100 mg by mouth daily.    [provider]  metroNIDAZOLE (METROCREAM) 0.75 % cream Apply 1 application topically 2 (two) times daily.    [provider]  mometasone-formoterol (DULERA) 200-5 MCG/ACT AERO Inhale 2 puffs into the lungs 2 (two) times daily.    [provider]  montelukast (SINGULAIR) 10 MG tablet Take 10 mg by mouth daily.      [provider]  omeprazole (  PRILOSEC) 40 MG capsule Take 40 mg by mouth daily.      [provider]  polyethylene glycol (MIRALAX / GLYCOLAX) packet Take 17 g by mouth as needed.    [provider]  potassium chloride SA (K-DUR,KLOR-CON) 20 MEQ tablet Take 20 mEq by mouth daily.      [provider]  pravastatin (PRAVACHOL) 20 MG tablet TAKE ONE TABLET BY MOUTH NIGHTLY AT BEDTIME 09/13/11   Lewayne Bunting, MD  Probiotic Product (PROBIOTIC & ACIDOPHILUS EX ST) CAPS Take 1 capsule by mouth daily.     [provider]  traMADol (ULTRAM) 50 MG tablet Take 50 mg by mouth 3 (three) times daily as needed.     [provider]    Family History History reviewed. No pertinent family history.  Social History Social History   Tobacco Use  . Smoking status: Never Smoker    . Smokeless tobacco: Never Used  . Tobacco comment: tobacco use- no  Substance Use Topics  . Alcohol use: Yes    Comment: occas.   . Drug use: Not on file     Allergies   Albuterol; Candesartan cilexetil; Clarithromycin; Doxycycline; Estradiol; Fluticasone-salmeterol; Gabapentin; Metoprolol tartrate; Peanut-containing drug products; and Simvastatin   Review of Systems Review of Systems  Constitutional: Negative for chills and fever.  Skin: Positive for color change. Negative for wound.     Physical Exam Triage Vital Signs ED Triage Vitals  Enc Vitals Group     BP      Pulse      Resp      Temp      Temp src      SpO2      Weight      Height      Head Circumference      Peak Flow      Pain Score      Pain Loc      Pain Edu?      Excl. in GC?    No data found.  Updated Vital Signs BP (!) 145/82 (BP Location: Right Arm)   Pulse 86   Temp 97.8 F (36.6 C) (Oral)   SpO2 99%   Visual Acuity Right Eye Distance:   Left Eye Distance:   Bilateral Distance:    Right Eye Near:   Left Eye Near:    Bilateral Near:     Physical Exam Vitals signs and nursing note reviewed.  Constitutional:      Appearance: Normal appearance. She is well-developed.  HENT:     Head: Normocephalic and atraumatic.     Ears:   Neck:     Musculoskeletal: Normal range of motion.  Cardiovascular:     Rate and Rhythm: Normal rate.  Pulmonary:     Effort: Pulmonary effort is normal.  Musculoskeletal: Normal range of motion.  Skin:    General: Skin is warm and dry.  Neurological:     Mental Status: She is alert and oriented to person, place, and time.  Psychiatric:        Behavior: Behavior normal.      UC Treatments / Results  Labs (all labs ordered are listed, but only abnormal results are displayed) Labs Reviewed - No data to display  EKG None  Radiology No results found.  Procedures Foreign Body Removal Date/Time: 06/13/2018 6:48 PM Performed by: Lurene Shadow, PA-C Authorized by: Lurene Shadow, PA-C   Consent:    Consent obtained:  Verbal  Consent given by:  Patient   Risks discussed:  Bleeding, infection, pain, worsening of condition and incomplete removal   Alternatives discussed:  No treatment and referral Location:    Location:  Ear   Ear location:  R ear   Depth:  Subcutaneous   Tendon involvement:  None Pre-procedure details:    Imaging:  None   Neurovascular status: intact     Preparation: Patient was prepped and draped in usual sterile fashion   Anesthesia (see MAR for exact dosages):    Anesthesia method:  Local infiltration   Local anesthetic:  Lidocaine 1% w/o epi Procedure type:    Procedure complexity:  Simple Procedure details:    Localization method:  Visualized and probed   Dissection of underlying tissues: no     Bloodless field: yes     Removal mechanism: fingers, able to manually manipulate earring back out the front of earlobe through original piercing.   Foreign bodies recovered:  1   Description:  Gold stud earring   Intact foreign body removal: yes   Post-procedure details:    Neurovascular status: intact     Confirmation:  No additional foreign bodies on visualization   Skin closure:  None   Dressing:  Antibiotic ointment and bulky dressing   Patient tolerance of procedure:  Tolerated well, no immediate complications   (including critical care time)  Medications Ordered in UC Medications - No data to display  Initial Impression / Assessment and Plan / UC Course  I have reviewed the triage vital signs and the nursing notes.  Pertinent labs & imaging results that were available during my care of the patient were reviewed by me and considered in my medical decision making (see chart for details).     Infected Right earlobe secondary to foreign body- gold earring that was successfully removed w/o complication Will tx infection with keflex Home care info provided   Final Clinical Impressions(s)  / UC Diagnoses   Final diagnoses:  Infected skin of earlobe, right  Acute foreign body of right earlobe, initial encounter     Discharge Instructions      Keep wound clean with warm water and mild soap.  You should wait until the skin has healed completely, at least a full 1-2 weeks until you try putting earrings back in.   You should avoid using small tiny stud earrings as you will likely have the same complication. Clip-on or hoop type earrings would be safest.  Please return to Urgent Care if symptoms not improving in 4-5 days, sooner if significantly worsening.     ED Prescriptions    Medication Sig Dispense Auth. Provider   cephALEXin (KEFLEX) 500 MG capsule Take 1 capsule (500 mg total) by mouth 2 (two) times daily. 14 capsule Lurene Shadow, PA-C     Controlled Substance Prescriptions [place] Controlled Substance Registry consulted? Not Applicable   Rolla Plate 06/13/18 8325

## 2018-06-15 ENCOUNTER — Ambulatory Visit: Payer: Medicare Other | Admitting: Physical Therapy

## 2018-06-15 DIAGNOSIS — R293 Abnormal posture: Secondary | ICD-10-CM

## 2018-06-15 DIAGNOSIS — M25561 Pain in right knee: Secondary | ICD-10-CM | POA: Diagnosis not present

## 2018-06-15 DIAGNOSIS — R531 Weakness: Secondary | ICD-10-CM

## 2018-06-15 NOTE — Therapy (Signed)
[facility] [place] [address] [place], [place], [address] Phone: [phone]   Fax:  [phone]  Physical Therapy Treatment  Patient Details  Name: Sherry Henson MRN: [id number] Date of Birth: 1928/02/15 Referring Provider (PT): Dr Rosie Fate    Encounter Date: 06/15/2018  PT End of Session - 06/15/18 1603    Visit Number  2    Number of Visits  12    Date for PT Re-Evaluation  07/21/18    PT Start Time  1523    PT Stop Time  1558   pt arrived late   PT Time Calculation (min)  35 min       Past Medical History:  Diagnosis Date  . Arthritis   . Asthma   . Cerebrovascular disease   . Diverticulosis   . GERD (gastroesophageal reflux disease)   . Hernia    hital  . HLD (hyperlipidemia)   . HTN (hypertension)   . Hypothyroidism   . Long Q-T syndrome   . PVD (peripheral vascular disease) (HCC)    ceiac artery and SMA stenosis, right renal artery stenosis    Past Surgical History:  Procedure Laterality Date  . APPENDECTOMY    . BUNIONECTOMY    . carpel tunnel surgery    . hysterectomy 9other)    . LAMINECTOMY    . TONSILLECTOMY    . torn rotator cuff surgery      There were no vitals filed for this visit.  Subjective Assessment - 06/15/18 1527    Subjective  Pt reports her knees hurt worse than when she was here the last visit.  Otherwise no new changes.     Currently in Pain?  Yes    Pain Score  7    initially reported as 10/10 .   Pain Location  Knee    Pain Orientation  Right;Left    Pain Descriptors / Indicators  Aching    Aggravating Factors   walking     Pain Relieving Factors  laying down.          [facility] PT Assessment - 06/15/18 0001      Assessment   Medical Diagnosis  Chronic Rt knee pain     Referring Provider (PT)  Dr Rosie Fate     Onset Date/Surgical Date  12/12/17   pain increasing over the past 2-3 years    Hand Dominance  Right    Next MD Visit  2-3 months     Prior  Therapy  yes here 2016 and 2018      [facility] Adult PT Treatment/Exercise - 06/15/18 0001      Knee/Hip Exercises: Stretches   Passive Hamstring Stretch  Right;Left;3 reps   2 seated, 1 supine with PTA assist.    Quad Stretch  Right;Left;1 rep;20 seconds   seated with foot under chair.    Quad Stretch Limitations  not tolerated well due to cramp in Lt hamstring.       Knee/Hip Exercises: Standing   Heel Raises  --   bilat 5 reps; UE support on walker   Functional Squat  1 set;5 reps   mini squats with RW   Other Standing Knee Exercises  standing hip/knee ext with cues for posture, and UE support on RW - x 4 reps after sit to stand.       Knee/Hip Exercises: Seated   Sit to Sand  with UE support   black mat to RW; 4 reps  Knee/Hip Exercises: Supine   Quad Sets  Both;1 set;10 reps   5 sec holds.    Bridges Limitations  trial of 3 reps; cramped in bilat hamstrings each attempt.       Manual Therapy   Manual Therapy  Soft tissue mobilization    Soft tissue mobilization   gentle STM to bilat quads with pt in hooklying position.              PT Education - 06/15/18 1659    Education Details  HEP - updated.     Person(s) Educated  Patient    Methods  Explanation;Demonstration;Handout;Verbal cues    Comprehension  Verbalized understanding;Returned demonstration          PT Long Term Goals - 06/09/18 1640      PT LONG TERM GOAL #1   Title  Improve upright posture and alignment with patient to demonstrate increased trunk; hip and knee extension 07/20/2018    Time  6    Period  Weeks    Status  New      PT LONG TERM GOAL #2   Title  Increase strength bilat LE's to 4/5 to 5-/5 target date 07/20/2018    Time  6    Period  Weeks    Status  New      PT LONG TERM GOAL #3   Title  Improve gait pattern with increased safety with rolling walker to improve independence and decresae risk of falls 07/20/2018    Time  6    Period  Weeks    Status  New      PT LONG TERM GOAL  #4   Title  Independent in HEP 07/20/2018    Time  6    Period  Weeks    Status  New      PT LONG TERM GOAL #5   Title  Improve FOTO to </=58% limitation 07/20/2018    Time  6    Period  Weeks    Status  New            Plan - 06/15/18 1656    Clinical Impression Statement  Pt with limited exercise tolerance and limited standing tolerance due to increased pain. Her legs cramped multiple times with attempts at bridges; eased with hamstring stretch.  After a few repetitions of standing upright (with RW) with cues, pt's posture was much improved.  Pt declined modalities; will do at home.  Pt will benefit from continued PT intervention to maximize functional mobility.     Rehab Potential  Fair    Clinical Impairments Affecting Rehab Potential  chronic nature of condition; sedentary lifestylr    PT Frequency  2x / week    PT Treatment/Interventions  Patient/family education;ADLs/Self Care Home Management;Cryotherapy;Electrical Stimulation;Iontophoresis /ml Dexamethasone;Moist Heat;Ultrasound;Dry needling;Manual techniques;Neuromuscular re-education;Balance training;Therapeutic exercise;Therapeutic activities;Gait training    PT Next Visit Plan  HEP; progress with ROm and strengthening Rt LE; modalities as indicated     PT Home Exercise Plan  Access Code: [email or web address]     Consulted and Agree with Plan of Care  Patient       Patient will benefit from skilled therapeutic intervention in order to improve the following deficits and impairments:  Pain, Postural dysfunction, Improper body mechanics, Increased fascial restricitons, Increased muscle spasms, Decreased strength, Decreased mobility, Decreased range of motion, Decreased activity tolerance  Visit Diagnosis: Acute pain of right knee  Weakness generalized  Abnormal posture     Problem List Patient Active Problem  List   Diagnosis Date Noted  . Dyspnea 08/31/2012  . SYNCOPE 11/21/2008  . HYPERCHOLESTEROLEMIA, PURE 05/01/2008  .  HYPERTENSION, BENIGN 05/01/2008  . CAROTID ARTERY STENOSIS, WITHOUT INFARCTION 05/01/2008  . RENAL ATHEROSCLEROSIS 05/01/2008  . PVD 05/01/2008  . LONG QT SYNDROME 05/01/2008   Mayer Camel, PTA 06/15/18 5:04 PM  [facility] [place] [address] [place], [place], [address] Phone: [phone]   Fax:  [phone]  Name: Sherry Henson MRN: [id number] Date of Birth: [birth year]

## 2018-06-15 NOTE — Patient Instructions (Signed)
Access Code: [email or web address]  URL: [email or web address]  Date: 06/15/2018  Prepared by: Mayer Camel   Exercises  Seated Hamstring Stretch - 3 reps - 1 sets - 1x daily - 7x weekly  Supine Quad Set - 10 reps - 2 sets - 5 sec hold - 2x daily - 7x weekly  Sit to Stand with Counter Support - 5 reps - 1 sets - 10 sec hold - 2x daily - 7x weekly  Mini Squat with Counter Support - 10 reps - 1 sets - 1x daily - 7x weekly  Standing Lumbar Extension - 2-3 reps - 1 sets - 5-10 hold - 1x daily - 7x weekly

## 2018-06-17 ENCOUNTER — Encounter: Payer: Self-pay | Admitting: Rehabilitative and Restorative Service Providers"

## 2018-06-17 ENCOUNTER — Ambulatory Visit: Payer: Medicare Other | Admitting: Rehabilitative and Restorative Service Providers"

## 2018-06-17 DIAGNOSIS — M25561 Pain in right knee: Secondary | ICD-10-CM

## 2018-06-17 DIAGNOSIS — R293 Abnormal posture: Secondary | ICD-10-CM

## 2018-06-17 DIAGNOSIS — R531 Weakness: Secondary | ICD-10-CM

## 2018-06-17 DIAGNOSIS — M2560 Stiffness of unspecified joint, not elsewhere classified: Secondary | ICD-10-CM

## 2018-06-17 DIAGNOSIS — R269 Unspecified abnormalities of gait and mobility: Secondary | ICD-10-CM | POA: Diagnosis not present

## 2018-06-17 NOTE — Therapy (Addendum)
[facility] [address] [place], [place], [address] Phone: [phone]   Fax:  [phone]  Physical Therapy Treatment  Patient Details  Name: Sherry Henson MRN: [id number] Date of Birth: 08/23/1927 Referring Provider (PT): Dr Aurea Graff    Encounter Date: 06/17/2018  PT End of Session - 06/17/18 1410    Visit Number  3    Number of Visits  12    Date for PT Re-Evaluation  07/21/18    PT Start Time  7106    PT Stop Time  1445    PT Time Calculation (min)  41 min    Activity Tolerance  Patient tolerated treatment well       Past Medical History:  Diagnosis Date  . Arthritis   . Asthma   . Cerebrovascular disease   . Diverticulosis   . GERD (gastroesophageal reflux disease)   . Hernia    hital  . HLD (hyperlipidemia)   . HTN (hypertension)   . Hypothyroidism   . Long Q-T syndrome   . PVD (peripheral vascular disease) ([facility])    ceiac artery and SMA stenosis, right renal artery stenosis    Past Surgical History:  Procedure Laterality Date  . APPENDECTOMY    . BUNIONECTOMY    . carpel tunnel surgery    . hysterectomy 9other)    . LAMINECTOMY    . TONSILLECTOMY    . torn rotator cuff surgery      There were no vitals filed for this visit.  Subjective Assessment - 06/17/18 1408    Subjective  Knees are feeling better with the stretching. The stretching seems to have helped. Some pain getting out of the car. No pain walking in the door for therapy.     Currently in Pain?  No/denies                       [facility] Adult PT Treatment/Exercise - 06/17/18 0001      Knee/Hip Exercises: Stretches   Passive Hamstring Stretch  Right;Left;3 reps;20 seconds;30 seconds   supine with PT assist.      Knee/Hip Exercises: Standing   Other Standing Knee Exercises  standing hip/knee ext back at wall - cues for posture, and UE support on RW 20-30 sec x 5 reps       Knee/Hip Exercises: Seated   Sit  to Sand  with UE support   elevatd mat table to RW; 4 reps     Knee/Hip Exercises: Supine   Quad Sets  Both;1 set;10 reps   5 sec hold post knee supported on noodle opposite knee bent    Bridges Limitations  trial of one rep - again created hamstring cramping     Straight Leg Raises  Strengthening;AAROM;Right;Left;1 set;10 reps    Straight Leg Raises Limitations  LE resting on foam roll - opposite knee bent quad set into SLR 3-5 sec hold       Shoulder Exercises: ROM/Strengthening   Nustep  L3 U/LE's (UE 13) x 5 min                   PT Long Term Goals - 06/09/18 1640      PT LONG TERM GOAL #1   Title  Improve upright posture and alignment with patient to demonstrate increased trunk; hip and knee extension 07/20/2018    Time  6    Period  Weeks    Status  New  PT LONG TERM GOAL #2   Title  Increase strength bilat LE's to 4/5 to 5-/5 target date 07/20/2018    Time  6    Period  Weeks    Status  New      PT LONG TERM GOAL #3   Title  Improve gait pattern with increased safety with rolling walker to improve independence and decresae risk of falls 07/20/2018    Time  6    Period  Weeks    Status  New      PT LONG TERM GOAL #4   Title  Independent in HEP 07/20/2018    Time  6    Period  Weeks    Status  New      PT LONG TERM GOAL #5   Title  Improve FOTO to </=58% limitation 07/20/2018    Time  6    Period  Weeks    Status  New            Plan - 06/17/18 1410    Clinical Impression Statement  Decreased pain and increased exercise tolerance today. Continue work toward goals of rehab.     Rehab Potential  Fair    Clinical Impairments Affecting Rehab Potential  chronic nature of condition; sedentary lifestylr    PT Frequency  2x / week    PT Treatment/Interventions  Patient/family education;ADLs/Self Care Home Management;Cryotherapy;Electrical Stimulation;Iontophoresis 70m/ml Dexamethasone;Moist Heat;Ultrasound;Dry needling;Manual techniques;Neuromuscular  re-education;Balance training;Therapeutic exercise;Therapeutic activities;Gait training    PT Next Visit Plan  HEP; progress with ROm and strengthening Rt LE; modalities as indicated     PT Home Exercise Plan  Access Code: [email or web address]    Consulted and Agree with Plan of Care  Patient       Patient will benefit from skilled therapeutic intervention in order to improve the following deficits and impairments:  Pain, Postural dysfunction, Improper body mechanics, Increased fascial restricitons, Increased muscle spasms, Decreased strength, Decreased mobility, Decreased range of motion, Decreased activity tolerance  Visit Diagnosis: Acute pain of right knee  Weakness generalized  Abnormal posture  Abnormal gait  Stiffness of multiple joints     Problem List Patient Active Problem List   Diagnosis Date Noted  . Dyspnea 08/31/2012  . SYNCOPE 11/21/2008  . HYPERCHOLESTEROLEMIA, PURE 05/01/2008  . HYPERTENSION, BENIGN 05/01/2008  . CAROTID ARTERY STENOSIS, WITHOUT INFARCTION 05/01/2008  . RENAL ATHEROSCLEROSIS 05/01/2008  . PVD 05/01/2008  . LONG QT SYNDROME 05/01/2008    Leara Rawl PNilda SimmerPT, MPH  06/17/2018, 2:49 PM  [facility] [address]Phone: [phone]  Fax:  [phone] Name: Sherry RafuseMRN: [id number] of Birth: 803/09/1927 PHYSICAL THERAPY DISCHARGE SUMMARY  Visits from Start of Care: 3  Current functional level related to goals / functional outcomes: See last progress note for discharge status    Remaining deficits: Unknown    Education / Equipment: HEP  Plan: Patient agrees to discharge.  Patient goals were partially met. Patient is being discharged due to not returning since the last visit.  ?????     CCedro PT, MPH  Acute Rehab [phone]

## 2018-06-20 ENCOUNTER — Encounter: Payer: Medicare Other | Admitting: Physical Therapy

## 2018-06-22 ENCOUNTER — Encounter: Payer: Medicare Other | Admitting: Rehabilitative and Restorative Service Providers"

## 2018-06-27 ENCOUNTER — Encounter: Payer: Medicare Other | Admitting: Physical Therapy

## 2018-06-29 ENCOUNTER — Encounter: Payer: Medicare Other | Admitting: Physical Therapy
